# Patient Record
Sex: Female | Born: 1985 | Race: Black or African American | Hispanic: No | Marital: Single | Smoking: Never smoker
Health system: Southern US, Community
[De-identification: ages and names within clinical notes are randomized; demographics above are authoritative.]

## PROBLEM LIST (undated history)

## (undated) ENCOUNTER — Inpatient Hospital Stay (HOSPITAL_COMMUNITY): Payer: Self-pay

## (undated) ENCOUNTER — Emergency Department (HOSPITAL_BASED_OUTPATIENT_CLINIC_OR_DEPARTMENT_OTHER): Admission: EM | Payer: Medicaid Other | Source: Home / Self Care

## (undated) DIAGNOSIS — I10 Essential (primary) hypertension: Secondary | ICD-10-CM

## (undated) DIAGNOSIS — Z8619 Personal history of other infectious and parasitic diseases: Secondary | ICD-10-CM

## (undated) HISTORY — PX: LAPAROSCOPY: SHX197

## (undated) HISTORY — DX: Personal history of other infectious and parasitic diseases: Z86.19

---

## 2006-01-08 ENCOUNTER — Emergency Department (HOSPITAL_COMMUNITY): Admission: EM | Admit: 2006-01-08 | Discharge: 2006-01-08 | Payer: Self-pay | Admitting: Emergency Medicine

## 2006-03-12 ENCOUNTER — Emergency Department (HOSPITAL_COMMUNITY): Admission: EM | Admit: 2006-03-12 | Discharge: 2006-03-12 | Payer: Self-pay | Admitting: Emergency Medicine

## 2010-08-27 ENCOUNTER — Emergency Department (HOSPITAL_COMMUNITY): Payer: Self-pay

## 2010-08-27 ENCOUNTER — Emergency Department (HOSPITAL_COMMUNITY)
Admission: EM | Admit: 2010-08-27 | Discharge: 2010-08-27 | Disposition: A | Discharge status: Home / Self Care | Payer: Self-pay | Attending: Emergency Medicine | Admitting: Emergency Medicine

## 2010-08-27 DIAGNOSIS — M79609 Pain in unspecified limb: Secondary | ICD-10-CM | POA: Insufficient documentation

## 2010-11-20 ENCOUNTER — Inpatient Hospital Stay (INDEPENDENT_AMBULATORY_CARE_PROVIDER_SITE_OTHER)
Admission: RE | Admit: 2010-11-20 | Discharge: 2010-11-20 | Disposition: A | Discharge status: Home / Self Care | Payer: Self-pay | Source: Ambulatory Visit | Attending: Family Medicine | Admitting: Family Medicine

## 2010-11-20 DIAGNOSIS — Z331 Pregnant state, incidental: Secondary | ICD-10-CM

## 2010-11-20 LAB — POCT URINALYSIS DIP (DEVICE)
Glucose, UA: NEGATIVE mg/dL
Nitrite: NEGATIVE
Protein, ur: NEGATIVE mg/dL
Specific Gravity, Urine: 1.02 (ref 1.005–1.030)
Urobilinogen, UA: 0.2 mg/dL (ref 0.0–1.0)

## 2010-11-20 LAB — POCT PREGNANCY, URINE: Preg Test, Ur: POSITIVE

## 2011-01-23 LAB — OB RESULTS CONSOLE ABO/RH: RH Type: POSITIVE

## 2011-01-23 LAB — OB RESULTS CONSOLE RPR: RPR: NONREACTIVE

## 2011-01-23 LAB — OB RESULTS CONSOLE GBS: GBS: POSITIVE

## 2011-01-23 LAB — OB RESULTS CONSOLE HEPATITIS B SURFACE ANTIGEN: Hepatitis B Surface Ag: NEGATIVE

## 2011-02-12 DIAGNOSIS — O26851 Spotting complicating pregnancy, first trimester: Secondary | ICD-10-CM | POA: Insufficient documentation

## 2011-02-12 DIAGNOSIS — Z87440 Personal history of urinary (tract) infections: Secondary | ICD-10-CM | POA: Insufficient documentation

## 2011-02-12 DIAGNOSIS — O10919 Unspecified pre-existing hypertension complicating pregnancy, unspecified trimester: Secondary | ICD-10-CM | POA: Insufficient documentation

## 2011-02-12 DIAGNOSIS — R638 Other symptoms and signs concerning food and fluid intake: Secondary | ICD-10-CM | POA: Insufficient documentation

## 2011-02-12 DIAGNOSIS — E669 Obesity, unspecified: Secondary | ICD-10-CM | POA: Insufficient documentation

## 2011-02-12 DIAGNOSIS — O0930 Supervision of pregnancy with insufficient antenatal care, unspecified trimester: Secondary | ICD-10-CM | POA: Insufficient documentation

## 2011-02-12 LAB — OB RESULTS CONSOLE GC/CHLAMYDIA: Chlamydia: NEGATIVE

## 2011-02-13 NOTE — L&D Delivery Note (Signed)
Delivery Note  At 11:03 PM a viable female was delivered via Vaginal, Spontaneous Delivery (Presentation: Right Occiput Anterior). Delivered through loose nuchal cord, shoulders delivered easily  APGAR: 8, 9; weight . pending Placenta status: Intact, Spontaneous.  Cord: 3 vessels with the following complications: None.  Cord pH: n/a  Anesthesia: Epidural  Episiotomy: None Lacerations: 1st degree Suture Repair: 3.0 vicryl rapide Est. Blood Loss (mL): 300 Increased bleeding noted, cytotec placed PR fundus firm and bleeding controlled   Mom to postpartum.  Baby to nursery-stable. Skin-skin Pt considering OCP for contraception   Shadi Larner M 07/19/2011, 11:45 PM

## 2011-04-09 ENCOUNTER — Other Ambulatory Visit: Payer: Self-pay

## 2011-05-01 ENCOUNTER — Other Ambulatory Visit (INDEPENDENT_AMBULATORY_CARE_PROVIDER_SITE_OTHER): Payer: Medicaid Other

## 2011-05-01 ENCOUNTER — Encounter (INDEPENDENT_AMBULATORY_CARE_PROVIDER_SITE_OTHER): Payer: Medicaid Other | Admitting: Obstetrics and Gynecology

## 2011-05-01 DIAGNOSIS — O10019 Pre-existing essential hypertension complicating pregnancy, unspecified trimester: Secondary | ICD-10-CM

## 2011-05-01 DIAGNOSIS — Z331 Pregnant state, incidental: Secondary | ICD-10-CM

## 2011-05-17 ENCOUNTER — Encounter (INDEPENDENT_AMBULATORY_CARE_PROVIDER_SITE_OTHER): Payer: Medicaid Other | Admitting: Obstetrics and Gynecology

## 2011-05-17 DIAGNOSIS — Z331 Pregnant state, incidental: Secondary | ICD-10-CM

## 2011-06-01 DIAGNOSIS — O0930 Supervision of pregnancy with insufficient antenatal care, unspecified trimester: Secondary | ICD-10-CM

## 2011-06-01 DIAGNOSIS — O26851 Spotting complicating pregnancy, first trimester: Secondary | ICD-10-CM

## 2011-06-01 DIAGNOSIS — O10919 Unspecified pre-existing hypertension complicating pregnancy, unspecified trimester: Secondary | ICD-10-CM

## 2011-06-01 DIAGNOSIS — E669 Obesity, unspecified: Secondary | ICD-10-CM

## 2011-06-01 DIAGNOSIS — R638 Other symptoms and signs concerning food and fluid intake: Secondary | ICD-10-CM

## 2011-06-01 DIAGNOSIS — O99820 Streptococcus B carrier state complicating pregnancy: Secondary | ICD-10-CM

## 2011-06-04 ENCOUNTER — Other Ambulatory Visit: Payer: Self-pay | Admitting: Obstetrics and Gynecology

## 2011-06-04 ENCOUNTER — Encounter: Payer: Self-pay | Admitting: Obstetrics and Gynecology

## 2011-06-04 ENCOUNTER — Other Ambulatory Visit: Payer: Medicaid Other | Admitting: Obstetrics and Gynecology

## 2011-06-04 ENCOUNTER — Ambulatory Visit (INDEPENDENT_AMBULATORY_CARE_PROVIDER_SITE_OTHER): Payer: Medicaid Other

## 2011-06-04 ENCOUNTER — Ambulatory Visit (INDEPENDENT_AMBULATORY_CARE_PROVIDER_SITE_OTHER): Payer: Medicaid Other | Admitting: Obstetrics and Gynecology

## 2011-06-04 VITALS — BP 120/84 | Ht 62.0 in | Wt 247.5 lb

## 2011-06-04 DIAGNOSIS — Z331 Pregnant state, incidental: Secondary | ICD-10-CM

## 2011-06-04 DIAGNOSIS — O10019 Pre-existing essential hypertension complicating pregnancy, unspecified trimester: Secondary | ICD-10-CM

## 2011-06-04 LAB — US OB FOLLOW UP

## 2011-06-04 NOTE — Patient Instructions (Signed)

## 2011-06-04 NOTE — Progress Notes (Signed)
Stacey Levine [redacted]w[redacted]d   Doing well GFM No ctx, VB or LOF Growth [place] today for [facility] EDC 6day ahead EFW 4lb9oz =54% Cx=4.95cm AFI 40% Posterior, grade 0 placenta Staring CBE this week, took BF classes  Plan: FKC PTL sx's rv'd Repeat growth Korea in 4wks ROB in 2wks

## 2011-06-11 NOTE — Progress Notes (Signed)
Pt needs biweekly NST, in addition to growth Korea every 4 weeks, secondary to [facility].  Please sched for NST w NV

## 2011-06-14 ENCOUNTER — Other Ambulatory Visit: Payer: Self-pay | Admitting: Obstetrics and Gynecology

## 2011-06-14 ENCOUNTER — Encounter: Payer: Self-pay | Admitting: Obstetrics and Gynecology

## 2011-06-14 DIAGNOSIS — O10919 Unspecified pre-existing hypertension complicating pregnancy, unspecified trimester: Secondary | ICD-10-CM

## 2011-06-18 ENCOUNTER — Ambulatory Visit (INDEPENDENT_AMBULATORY_CARE_PROVIDER_SITE_OTHER): Payer: Medicaid Other | Admitting: Obstetrics and Gynecology

## 2011-06-18 ENCOUNTER — Ambulatory Visit: Payer: Medicaid Other

## 2011-06-18 ENCOUNTER — Encounter: Payer: Self-pay | Admitting: Obstetrics and Gynecology

## 2011-06-18 DIAGNOSIS — O169 Unspecified maternal hypertension, unspecified trimester: Secondary | ICD-10-CM

## 2011-06-18 DIAGNOSIS — IMO0002 Reserved for concepts with insufficient information to code with codable children: Secondary | ICD-10-CM

## 2011-06-18 NOTE — Progress Notes (Signed)
A/P GBS due at 36 weeks CHTN NST twice a week Korea at 36 weeks for growth Fetal kick counts reviewed Labor reviewed with pt All patients  questions answered NST reactive

## 2011-06-18 NOTE — Patient Instructions (Signed)
Fetal Monitoring, Fetal Movement Assessment Fetal movement assessment (FMA) is done by the pregnant woman herself by counting and recording the baby's movements over a certain time period. It is done to see if there are problems with the pregnancy and the baby. Identifying and correcting problems may prevent serious problems from developing with the fetus, including fetal loss. Some pregnancies are complicated by the mother's medical problems. Some of these problems are type 1 diabetes mellitus, high blood pressure and other chronic medical illnesses. This is why it is important to monitor the baby before birth.  OTHER TECHNIQUES OF MONITORING YOUR BABY BEFORE BIRTH Several tests are in use. These include:  Nonstress test (NST). This test monitors the baby's heart rate when the baby moves.   Contraction stress test (CST). This test monitors the baby's heart rate during a contraction of the uterus.   Fetal biophysical profile (BPP). This measures and evaluates 5 observations of the baby:   The nonstress test.   The baby's breathing.   The baby's movements.   The baby's muscle tone.   The amount of amniotic fluid.   Modified BPP. This measures the volume of fluid in different parts of the amniotic sac (amniotic fluid index) and the results of the nonstress test.   Umbilical artery doppler velocimetry. This evaluates the blood flow through the umbilical cord.  There are several very serious problems that cannot be predicted or detected with any of the fetal monitoring procedures. These problems include separation (abruption) of the placenta or when the fetus chokes on the umbilical cord (umbilical cord accident). Your caregiver will help you understand your tests and what they mean for you and your baby. It is your responsibility to obtain the results of your test. LET YOUR CAREGIVER KNOW ABOUT:   Any medications you are taking including prescription and over-the-counter drugs, herbs, eye  drops and creams.   If you have a fever.   If you have an infection.   If you are sick.  RISKS AND COMPLICATIONS  There are no risks or complications to the mother or fetus with FMA. BEFORE THE PROCEDURE  Do not take medications that may decrease or increase the baby's heart rate and/or movements.   Eat a full meal at least 2 hours before the test.   Do not smoke if you are pregnant. If you smoke, stop at least 2 days before the test. It is best not to smoke at all when you are pregnant.  PROCEDURE Sometimes, a mother notices her baby moves less before there are problems. Because of this, it is believed that fetal movement checking by the mother (kick counts) is a good way to check the baby before birth. There are different ways of doing this. Two good ways are:  The woman lies on her side and counts distinct (individual) fetal movements. A feeling of 10 distinct movements in a period of up to 2 hours is considered reassuring. When 10 movements are felt, you may stop counting.   Women are instructed to count fetal movements for 1 hour, three times per week. The count is good if, after one week, it equals or is over the woman's previously established baseline count. If the count is lower, further checking of your baby is needed.  AFTER THE PROCEDURE You may resume your usual activities. HOME CARE INSTRUCTIONS   Follow your caregiver's advice and recommendations.   Be aware of your baby's movements. Are they normal, less than usual or more than usual?     Make and keep the rest of your prenatal appointments.  SEEK MEDICAL CARE IF:   You develop a temperature of 100 F (37.8 C) or higher.   You have a bloody mucus discharge from the vagina (a bloody show).  SEEK IMMEDIATE MEDICAL CARE IF:   You do not feel the baby move.   You think the baby's movements are too little or too many.   You develop contractions.   You develop vaginal bleeding.   You have belly (abdominal) pain.    You have leaking or a gush of fluid from the vagina.  Document Released: 01/19/2002 Document Revised: 01/18/2011 Document Reviewed: 05/24/2008 Exit[employer] Patient Information 2012 [employer]. 

## 2011-06-21 ENCOUNTER — Other Ambulatory Visit: Payer: Self-pay | Admitting: Obstetrics and Gynecology

## 2011-06-21 ENCOUNTER — Ambulatory Visit (INDEPENDENT_AMBULATORY_CARE_PROVIDER_SITE_OTHER): Payer: Medicaid Other | Admitting: Obstetrics and Gynecology

## 2011-06-21 VITALS — BP 130/80

## 2011-06-21 DIAGNOSIS — O10019 Pre-existing essential hypertension complicating pregnancy, unspecified trimester: Secondary | ICD-10-CM

## 2011-06-21 DIAGNOSIS — O10919 Unspecified pre-existing hypertension complicating pregnancy, unspecified trimester: Secondary | ICD-10-CM

## 2011-06-21 NOTE — Progress Notes (Signed)
NST ONLY, PT DENIES ANY SXS OR COMPLICATIONS TODAY.

## 2011-06-21 NOTE — Progress Notes (Signed)
NST only today for chronic hypertension--Reactive, sporadic UCs that patient is not aware of.

## 2011-06-25 ENCOUNTER — Ambulatory Visit: Payer: Medicaid Other

## 2011-06-25 ENCOUNTER — Ambulatory Visit (INDEPENDENT_AMBULATORY_CARE_PROVIDER_SITE_OTHER): Payer: Medicaid Other | Admitting: Obstetrics and Gynecology

## 2011-06-25 VITALS — BP 136/74

## 2011-06-25 DIAGNOSIS — O10919 Unspecified pre-existing hypertension complicating pregnancy, unspecified trimester: Secondary | ICD-10-CM

## 2011-06-25 DIAGNOSIS — O10019 Pre-existing essential hypertension complicating pregnancy, unspecified trimester: Secondary | ICD-10-CM

## 2011-06-25 DIAGNOSIS — Z331 Pregnant state, incidental: Secondary | ICD-10-CM

## 2011-06-25 NOTE — Progress Notes (Signed)
Patient ID: Stacey Levine, female   DOB: Dec 18, 1985, 26 y.o.   MRN: [id number] NST reactive, f/o as scheduled. Lavera Guise, CNM

## 2011-06-28 ENCOUNTER — Other Ambulatory Visit: Payer: Medicaid Other

## 2011-06-28 ENCOUNTER — Ambulatory Visit (INDEPENDENT_AMBULATORY_CARE_PROVIDER_SITE_OTHER): Payer: Medicaid Other | Admitting: Obstetrics and Gynecology

## 2011-06-28 VITALS — BP 142/76 | Wt 255.0 lb

## 2011-06-28 DIAGNOSIS — IMO0002 Reserved for concepts with insufficient information to code with codable children: Secondary | ICD-10-CM

## 2011-06-28 DIAGNOSIS — Z331 Pregnant state, incidental: Secondary | ICD-10-CM

## 2011-06-28 DIAGNOSIS — O139 Gestational [pregnancy-induced] hypertension without significant proteinuria, unspecified trimester: Secondary | ICD-10-CM

## 2011-06-28 NOTE — Progress Notes (Signed)
BP RE-CHECK=134/84  KNP;PT WENT TO BR BEFORE LEAVING AND SAW SOME BLOOD W/ WIPING ONLY, PT STATES DID HAVE CX CHECKED TODAY, PT INFORMED NORMAL TO HAVE SOME SPOTTING AFTER A CX CHECK, ADVISED TO MONITOR, IF INCREASES OR OTHER CONCERNS TO CALL BACK ANYTIME.

## 2011-06-28 NOTE — Progress Notes (Signed)
Pt complains of feet swelling and hurts when walking JM

## 2011-06-28 NOTE — Progress Notes (Signed)
CHTN: denies symptoms of PIH except edema 3+ BH No change in discharge Labor signs reviewed PIH warnings given

## 2011-07-02 ENCOUNTER — Ambulatory Visit (INDEPENDENT_AMBULATORY_CARE_PROVIDER_SITE_OTHER): Payer: Medicaid Other | Admitting: Obstetrics and Gynecology

## 2011-07-02 VITALS — BP 120/78 | Wt 254.0 lb

## 2011-07-02 DIAGNOSIS — O10019 Pre-existing essential hypertension complicating pregnancy, unspecified trimester: Secondary | ICD-10-CM

## 2011-07-02 DIAGNOSIS — O99810 Abnormal glucose complicating pregnancy: Secondary | ICD-10-CM

## 2011-07-02 DIAGNOSIS — O24419 Gestational diabetes mellitus in pregnancy, unspecified control: Secondary | ICD-10-CM

## 2011-07-02 DIAGNOSIS — I10 Essential (primary) hypertension: Secondary | ICD-10-CM

## 2011-07-02 DIAGNOSIS — Z331 Pregnant state, incidental: Secondary | ICD-10-CM

## 2011-07-02 DIAGNOSIS — Z3490 Encounter for supervision of normal pregnancy, unspecified, unspecified trimester: Secondary | ICD-10-CM

## 2011-07-02 LAB — CULTURE, BETA STREP (GROUP B ONLY)

## 2011-07-02 NOTE — Progress Notes (Addendum)
No complaints Denies headache, visual changes, or abdominal pain NST reactive Ultrasound next available for size greater than dates Fetal kick counts and labor precautions Group B strep was negative Return to office for ultrasound in about a week Continue twice weekly NSTs and at next visit with ultrasound for EFW, will check BPP

## 2011-07-02 NOTE — Progress Notes (Signed)
Pt. No issues today. Nst today.per AR NST reactive.

## 2011-07-03 ENCOUNTER — Encounter: Payer: Self-pay | Admitting: Obstetrics and Gynecology

## 2011-07-05 ENCOUNTER — Ambulatory Visit (INDEPENDENT_AMBULATORY_CARE_PROVIDER_SITE_OTHER): Payer: Medicaid Other | Admitting: Obstetrics and Gynecology

## 2011-07-05 VITALS — BP 142/80

## 2011-07-05 DIAGNOSIS — O10019 Pre-existing essential hypertension complicating pregnancy, unspecified trimester: Secondary | ICD-10-CM

## 2011-07-05 DIAGNOSIS — O10919 Unspecified pre-existing hypertension complicating pregnancy, unspecified trimester: Secondary | ICD-10-CM

## 2011-07-10 ENCOUNTER — Other Ambulatory Visit: Payer: Self-pay | Admitting: Obstetrics and Gynecology

## 2011-07-10 ENCOUNTER — Encounter: Payer: Self-pay | Admitting: Obstetrics and Gynecology

## 2011-07-10 ENCOUNTER — Ambulatory Visit (INDEPENDENT_AMBULATORY_CARE_PROVIDER_SITE_OTHER): Payer: Medicaid Other

## 2011-07-10 ENCOUNTER — Ambulatory Visit (INDEPENDENT_AMBULATORY_CARE_PROVIDER_SITE_OTHER): Payer: Medicaid Other | Admitting: Obstetrics and Gynecology

## 2011-07-10 VITALS — BP 130/80 | Wt 266.0 lb

## 2011-07-10 DIAGNOSIS — Z3490 Encounter for supervision of normal pregnancy, unspecified, unspecified trimester: Secondary | ICD-10-CM

## 2011-07-10 DIAGNOSIS — I10 Essential (primary) hypertension: Secondary | ICD-10-CM

## 2011-07-10 DIAGNOSIS — O10019 Pre-existing essential hypertension complicating pregnancy, unspecified trimester: Secondary | ICD-10-CM

## 2011-07-10 LAB — COMPREHENSIVE METABOLIC PANEL
Albumin: 2.6 g/dL — ABNORMAL LOW (ref 3.5–5.2)
BUN: 7 mg/dL (ref 6–23)
CO2: 22 mEq/L (ref 19–32)
Calcium: 9.2 mg/dL (ref 8.4–10.5)
Chloride: 107 mEq/L (ref 96–112)
Creat: 0.84 mg/dL (ref 0.50–1.10)
Glucose, Bld: 68 mg/dL — ABNORMAL LOW (ref 70–99)
Potassium: 4.6 mEq/L (ref 3.5–5.3)

## 2011-07-10 LAB — CBC
HCT: 33 % — ABNORMAL LOW (ref 36.0–46.0)
Hemoglobin: 11.4 g/dL — ABNORMAL LOW (ref 12.0–15.0)
MCHC: 34.5 g/dL (ref 30.0–36.0)
RBC: 3.88 MIL/uL (ref 3.87–5.11)
WBC: 5.6 10*3/uL (ref 4.0–10.5)

## 2011-07-10 LAB — URIC ACID: Uric Acid, Serum: 6 mg/dL (ref 2.4–7.0)

## 2011-07-10 LAB — US OB FOLLOW UP

## 2011-07-10 LAB — LACTATE DEHYDROGENASE: LDH: 175 U/L (ref 94–250)

## 2011-07-10 NOTE — Progress Notes (Signed)
Addendum: Had 24hr urine at 18wks=150mg  total prot CMP at 27wks was nl VE today=3/80/-1

## 2011-07-10 NOTE — Progress Notes (Signed)
Wants cx today

## 2011-07-10 NOTE — Progress Notes (Signed)
[redacted]w[redacted]d Growth Korea today - EFW 7# 70%,  AFI - 55%  BPP 8/8 Had +GBS in urine at NOB - will plan to tx in labor  Doing well, 3+ pitting pedal edema, comfort measures discussed VE=3/80/-1 Denies any PIH sx's PIH labs today (normal) and 24hour urine to be collected and return NV BP stable 130/80 today rv'd FKC and labor sx's Plans: biweekly NST's, will sched for Fri NST only and to return 24hr urine ROB and NST next Tuesday

## 2011-07-10 NOTE — Patient Instructions (Signed)

## 2011-07-10 NOTE — Progress Notes (Signed)
NST reactive.

## 2011-07-11 ENCOUNTER — Telehealth: Payer: Self-pay | Admitting: Obstetrics and Gynecology

## 2011-07-11 NOTE — Telephone Encounter (Signed)
TC to pt.  Appts scheduled per SL.   Pt states still has some edema but is resting with feet elevated.   +FM  To call with any increase in sx or concerns. Pt verbalizes comprehension.

## 2011-07-11 NOTE — Telephone Encounter (Signed)
Message copied by Mason Jim on Wed Jul 11, 2011 11:12 AM ------      Message from: Malissa Hippo.      Created: Tue Jul 10, 2011 11:48 PM      Regarding: Pt needs sched for NST       Pt has CHTN            Pt needs NST only Friday - she can return 24hour urine than - please notify on call CNM to look for results (I'm actually on that night), please leave me VM if I need to look for it.       NST and ROB w provider next Tuesday and NST next Friday      I don't see that she scheduled anymore visits??            Thanks      SL

## 2011-07-13 ENCOUNTER — Ambulatory Visit (INDEPENDENT_AMBULATORY_CARE_PROVIDER_SITE_OTHER): Payer: Medicaid Other

## 2011-07-13 ENCOUNTER — Inpatient Hospital Stay (HOSPITAL_COMMUNITY)
Admission: AD | Admit: 2011-07-13 | Discharge: 2011-07-13 | Disposition: A | Discharge status: Home / Self Care | Payer: Medicaid Other | Source: Ambulatory Visit | Attending: Obstetrics and Gynecology | Admitting: Obstetrics and Gynecology

## 2011-07-13 ENCOUNTER — Encounter (HOSPITAL_COMMUNITY): Payer: Self-pay

## 2011-07-13 ENCOUNTER — Other Ambulatory Visit: Payer: Medicaid Other

## 2011-07-13 VITALS — BP 138/84

## 2011-07-13 DIAGNOSIS — R079 Chest pain, unspecified: Secondary | ICD-10-CM | POA: Insufficient documentation

## 2011-07-13 DIAGNOSIS — K219 Gastro-esophageal reflux disease without esophagitis: Secondary | ICD-10-CM

## 2011-07-13 DIAGNOSIS — O10019 Pre-existing essential hypertension complicating pregnancy, unspecified trimester: Secondary | ICD-10-CM | POA: Insufficient documentation

## 2011-07-13 DIAGNOSIS — Z3400 Encounter for supervision of normal first pregnancy, unspecified trimester: Secondary | ICD-10-CM

## 2011-07-13 DIAGNOSIS — Z3403 Encounter for supervision of normal first pregnancy, third trimester: Secondary | ICD-10-CM

## 2011-07-13 DIAGNOSIS — O99891 Other specified diseases and conditions complicating pregnancy: Secondary | ICD-10-CM | POA: Insufficient documentation

## 2011-07-13 DIAGNOSIS — Z331 Pregnant state, incidental: Secondary | ICD-10-CM

## 2011-07-13 DIAGNOSIS — I10 Essential (primary) hypertension: Secondary | ICD-10-CM

## 2011-07-13 LAB — PROTEIN, URINE, 24 HOUR
Protein, 24H Urine: 124 mg/d — ABNORMAL HIGH (ref 50–100)
Protein, Urine: 15 mg/dL

## 2011-07-13 LAB — CREATININE CLEARANCE, URINE, 24 HOUR: Creatinine: 0.84 mg/dL (ref 0.50–1.10)

## 2011-07-13 MED ORDER — PANTOPRAZOLE SODIUM 40 MG PO TBEC
40.0000 mg | DELAYED_RELEASE_TABLET | Freq: Every day | ORAL | Status: DC
  Administered 2011-07-13: 40 mg via ORAL
  Filled 2011-07-13 (×2): qty 1

## 2011-07-13 MED ORDER — PANTOPRAZOLE SODIUM 40 MG PO TBEC: 40.0000 mg | DELAYED_RELEASE_TABLET | Freq: Every day | ORAL | Status: DC

## 2011-07-13 NOTE — Discharge Instructions (Signed)
Heartburn During Pregnancy  Heartburn is a burning sensation in the chest caused by stomach acid backing up into the esophagus. Heartburn (also known as "reflux") is common in pregnancy because a certain hormone (progesterone) changes. The progesterone hormone may relax the valve that separates the esophagus from the stomach. This allows acid to go up into the esophagus, causing heartburn. Heartburn may also happen in pregnancy because the enlarging uterus pushes up on the stomach, which pushes more acid into the esophagus. This is especially true in the later stages of pregnancy. Heartburn problems usually go away after giving birth. CAUSES   The progesterone hormone.   Changing hormone levels.   The growing uterus that pushes stomach acid upward.   Large meals.   Certain foods and drinks.   Exercise.   Increased acid production.  SYMPTOMS   Burning pain in the chest or lower throat.   Bitter taste in the mouth.   Coughing.  DIAGNOSIS  Heartburn is typically diagnosed by your caregiver when taking a careful history of your concern. Your caregiver may order a blood test to check for a certain type of bacteria that is associated with heartburn. Sometimes, heartburn is diagnosed by prescribing a heartburn medicine to see if the symptoms improve. It is rare in pregnancy to have a procedure called an endoscopy. This is when a tube with a light and a camera on the end is used to examine the esophagus and the stomach. TREATMENT   Your caregiver may tell you to use certain over-the-counter medicines (antacids, acid reducers) for mild heartburn.   Your caregiver may prescribe medicines to decrease stomach acid or to protect your stomach lining.   Your caregiver may recommend certain diet changes.   For severe cases, your caregiver may recommend that the head of the bed be elevated on blocks. (Sleeping with more pillows is not an effective treatment as it only changes the position of your  head and does not improve the main problem of stomach acid refluxing into the esophagus.)  HOME CARE INSTRUCTIONS   Take all medicines as directed by your caregiver.   Raise the head of your bed by putting blocks under the legs if instructed to by your caregiver.   Do not exercise right after eating.   Avoid eating 2 or 3 hours before bed. Do not lie down right after eating.   Eat small meals throughout the day instead of 3 large meals.   Identify foods and beverages that make your symptoms worse and avoid them. Foods you may want to avoid include:   Peppers.   Chocolate.   High-fat foods, including fried foods.   Spicy foods.   Garlic and onions.   Citrus fruits, including oranges, grapefruit, lemons, and limes.   Food containing tomatoes or tomato products.   Mint.   Carbonated and caffeinated drinks.   Vinegar.  SEEK IMMEDIATE MEDICAL CARE IF:   You have severe chest pain that goes down your arm or into your jaw or neck.   You feel sweaty, dizzy, or lightheaded.   You become short of breath.   You vomit blood.   You have difficulty or pain with swallowing.   You have bloody or black, tarry stools.   You have episodes of heartburn more than 3 times a week, for more than 2 weeks.  MAKE SURE YOU:  Understand these instructions.   Will watch your condition.   Will get help right away if you are not doing well or   get worse.  Document Released: 01/27/2000 Document Revised: 01/18/2011 Document Reviewed: 07/20/2010 Exit[employer] Patient Information 2012 [employer]. 

## 2011-07-13 NOTE — MAU Provider Note (Signed)
History   26 yo G1P0 at 38 weeks presented after calling office c/o "chest pain" and some cramping.  Denies HA, visual symptoms, or epigastric pain.  Turned in 24 hour urine today at office, with PIH labs done on Wednesday due to pedal edema.  Chest discomfort is usually at night, after lying down, feels like burning in mid-chest and epigastric area.  Resolves spontaneously.  No vomiting, fever, or any other symptoms.  Has not been on any medication for GERD.  Pregnancy remarkable for: Patient Active Problem List  Diagnoses  . Chronic hypertension in pregnancy  . Obesity  . Late prenatal care  . Increased BMI  . Spotting in first trimester  . GBS (group B Streptococcus carrier), +RV culture, currently pregnant  . GERD (gastroesophageal reflux disease)     Chief Complaint  Patient presents with  . Chest Pain  . Abdominal Pain    OB History    Grav Para Term Preterm Abortions TAB SAB Ect Mult Living   1               Past Medical History  Diagnosis Date  . H/O varicella   . H/O cystitis   . Yeast infection   . GERD (gastroesophageal reflux disease)     Past Surgical History  Procedure Date  . No past surgeries     Family History  Problem Relation Age of Onset  . Hypertension Mother   . Stroke Maternal Uncle   . Diabetes Maternal Grandmother   . Hypertension Maternal Grandfather   . Mental illness Paternal Grandfather   . Asthma Cousin   . Anesthesia problems Neg Hx   . Hypotension Neg Hx   . Malignant hyperthermia Neg Hx   . Pseudochol deficiency Neg Hx     History  Substance Use Topics  . Smoking status: Never Smoker   . Smokeless tobacco: Never Used  . Alcohol Use: No    Allergies:  Allergies  Allergen Reactions  . Pollen Extract Itching and Swelling    Prescriptions prior to admission  Medication Sig Dispense Refill  . Prenatal Vit-Fe Fumarate-FA (PRENATAL MULTIVITAMIN) TABS Take 1 tablet by mouth at bedtime.          Physical Exam   Blood  pressure 136/76, pulse 78, temperature 98.4 F (36.9 C), temperature source Oral, resp. rate 20, height 5\' 5"  (1.651 m), weight 266 lb (120.657 kg), last menstrual period 10/20/2010, SpO2 99.00%.  Chest clear Heart RRR without murmur Abd gravid, NT, no RUQ pain Pelvic--deferred per patient request Ext DTR 2+ without clonus, 1-2+ edema.  FHR reactive, no decels Very occasional contractions, mild.   ED Course  IUP at 38 weeks GERD 24 hour urine pending  Plan: D/C home with Rx for Protonix e-prescribed PIH precautions reviewed. Await 24 hour urine. GERD information given. Follow-up as scheduled at [facility] on Tuesday for NST and visit.  Nigel Bridgeman, CNM, [place] 07/13/11 6:20pm

## 2011-07-13 NOTE — MAU Note (Signed)
No adverse effect from protonix

## 2011-07-13 NOTE — MAU Note (Signed)
VLatham, CNM on unit in MAU, notified that pt is here for sternal chest pain, upper then generalized abd pain. VLatham, CNM will see pt shortly.

## 2011-07-13 NOTE — MAU Note (Signed)
Patient states she has been having chest (patient points to mid sternal) twice in the past 2 weeks with the last time on 5-29.Marland Kitchen Has been having abdominal pain(patient points to epigastric area) since this am. Having some contractions off and on. Denies bleeding or leaking and reports good fetal movement.

## 2011-07-13 NOTE — MAU Note (Signed)
Pt states has had sternal chest pain x2 weeks. Usually gets pain while in bed. Also noted upper abdominal pain down into lower abdomen as well that began this am. Pain was upper abdominal, then gradually radiated down. Hx acid reflux with pregnancy. Denies vaginal d/c changes or bleeding.

## 2011-07-15 ENCOUNTER — Telehealth: Payer: Self-pay | Admitting: Obstetrics and Gynecology

## 2011-07-15 NOTE — Telephone Encounter (Signed)
TC from [facility]--24 hour urine protein 124. No f/u needed.

## 2011-07-17 ENCOUNTER — Encounter: Payer: Medicaid Other | Admitting: Obstetrics and Gynecology

## 2011-07-17 ENCOUNTER — Ambulatory Visit (INDEPENDENT_AMBULATORY_CARE_PROVIDER_SITE_OTHER): Payer: Medicaid Other | Admitting: Obstetrics and Gynecology

## 2011-07-17 ENCOUNTER — Ambulatory Visit: Payer: Medicaid Other

## 2011-07-17 VITALS — BP 140/90 | Wt 272.0 lb

## 2011-07-17 DIAGNOSIS — Z331 Pregnant state, incidental: Secondary | ICD-10-CM

## 2011-07-17 DIAGNOSIS — O10019 Pre-existing essential hypertension complicating pregnancy, unspecified trimester: Secondary | ICD-10-CM

## 2011-07-17 DIAGNOSIS — I10 Essential (primary) hypertension: Secondary | ICD-10-CM

## 2011-07-17 DIAGNOSIS — O10919 Unspecified pre-existing hypertension complicating pregnancy, unspecified trimester: Secondary | ICD-10-CM

## 2011-07-17 NOTE — Progress Notes (Signed)
2nd attempt BP 140/90  Declines cx check today NST Reactive today

## 2011-07-17 NOTE — Progress Notes (Signed)
[redacted]w[redacted]d GFM Some ctx, no VB Denies HA/N/VRUQ pain NST reactive today cat 1 From 5-31, 24hour urine protein =124, PIH labs were normal  Pt has NST on Friday, will schedule growth [place] next week w BPP

## 2011-07-18 ENCOUNTER — Telehealth: Payer: Self-pay | Admitting: Obstetrics and Gynecology

## 2011-07-18 ENCOUNTER — Encounter: Payer: Medicaid Other | Admitting: Obstetrics and Gynecology

## 2011-07-18 NOTE — Telephone Encounter (Signed)
Triage/pt has appt coming up

## 2011-07-18 NOTE — Telephone Encounter (Signed)
Triage/2nd call 

## 2011-07-19 ENCOUNTER — Encounter (HOSPITAL_COMMUNITY): Payer: Self-pay | Admitting: Anesthesiology

## 2011-07-19 ENCOUNTER — Inpatient Hospital Stay (HOSPITAL_COMMUNITY): Payer: Medicaid Other | Admitting: Anesthesiology

## 2011-07-19 ENCOUNTER — Inpatient Hospital Stay (HOSPITAL_COMMUNITY)
Admission: AD | Admit: 2011-07-19 | Discharge: 2011-07-21 | DRG: 774 | Disposition: A | Discharge status: Home / Self Care | Payer: Medicaid Other | Source: Ambulatory Visit | Attending: Obstetrics and Gynecology | Admitting: Obstetrics and Gynecology

## 2011-07-19 ENCOUNTER — Encounter (HOSPITAL_COMMUNITY): Payer: Self-pay

## 2011-07-19 ENCOUNTER — Telehealth: Payer: Self-pay | Admitting: Obstetrics and Gynecology

## 2011-07-19 DIAGNOSIS — E669 Obesity, unspecified: Secondary | ICD-10-CM | POA: Diagnosis present

## 2011-07-19 DIAGNOSIS — Z22330 Carrier of Group B streptococcus: Secondary | ICD-10-CM

## 2011-07-19 DIAGNOSIS — R638 Other symptoms and signs concerning food and fluid intake: Secondary | ICD-10-CM | POA: Diagnosis present

## 2011-07-19 DIAGNOSIS — K219 Gastro-esophageal reflux disease without esophagitis: Secondary | ICD-10-CM | POA: Diagnosis present

## 2011-07-19 DIAGNOSIS — O1002 Pre-existing essential hypertension complicating childbirth: Principal | ICD-10-CM | POA: Diagnosis present

## 2011-07-19 DIAGNOSIS — Z87440 Personal history of urinary (tract) infections: Secondary | ICD-10-CM

## 2011-07-19 DIAGNOSIS — O99892 Other specified diseases and conditions complicating childbirth: Secondary | ICD-10-CM | POA: Diagnosis present

## 2011-07-19 DIAGNOSIS — O8620 Urinary tract infection following delivery, unspecified: Secondary | ICD-10-CM | POA: Diagnosis not present

## 2011-07-19 DIAGNOSIS — D649 Anemia, unspecified: Secondary | ICD-10-CM | POA: Diagnosis not present

## 2011-07-19 DIAGNOSIS — O10919 Unspecified pre-existing hypertension complicating pregnancy, unspecified trimester: Secondary | ICD-10-CM | POA: Diagnosis present

## 2011-07-19 DIAGNOSIS — IMO0002 Reserved for concepts with insufficient information to code with codable children: Secondary | ICD-10-CM | POA: Insufficient documentation

## 2011-07-19 DIAGNOSIS — O09899 Supervision of other high risk pregnancies, unspecified trimester: Secondary | ICD-10-CM

## 2011-07-19 DIAGNOSIS — O9903 Anemia complicating the puerperium: Secondary | ICD-10-CM | POA: Diagnosis not present

## 2011-07-19 DIAGNOSIS — R7401 Elevation of levels of liver transaminase levels: Secondary | ICD-10-CM

## 2011-07-19 DIAGNOSIS — O9081 Anemia of the puerperium: Secondary | ICD-10-CM | POA: Diagnosis not present

## 2011-07-19 HISTORY — DX: Essential (primary) hypertension: I10

## 2011-07-19 LAB — COMPREHENSIVE METABOLIC PANEL
Albumin: 2.5 g/dL — ABNORMAL LOW (ref 3.5–5.2)
BUN: 5 mg/dL — ABNORMAL LOW (ref 6–23)
Chloride: 107 mEq/L (ref 96–112)
Creatinine, Ser: 0.73 mg/dL (ref 0.50–1.10)
GFR calc Af Amer: >90 mL/min (ref >90)
GFR calc non Af Amer: >90 mL/min (ref >90)
Glucose, Bld: 73 mg/dL (ref 70–99)
Total Bilirubin: 0.3 mg/dL (ref 0.3–1.2)

## 2011-07-19 LAB — CBC
HCT: 30 % — ABNORMAL LOW (ref 36.0–46.0)
Hemoglobin: 10.1 g/dL — ABNORMAL LOW (ref 12.0–15.0)
MCH: 29.1 pg (ref 26.0–34.0)
MCH: 29 pg (ref 26.0–34.0)
MCHC: 33.7 g/dL (ref 30.0–36.0)
MCV: 86.1 fL (ref 78.0–100.0)
MCV: 86.2 fL (ref 78.0–100.0)
Platelets: 285 10*3/uL (ref 150–400)
RBC: 3.82 MIL/uL — ABNORMAL LOW (ref 3.87–5.11)
RDW: 14.4 % (ref 11.5–15.5)

## 2011-07-19 MED ORDER — DIPHENHYDRAMINE HCL 50 MG/ML IJ SOLN: 12.5000 mg | INTRAMUSCULAR | Status: DC | PRN

## 2011-07-19 MED ORDER — ACETAMINOPHEN 325 MG PO TABS: 650.0000 mg | ORAL_TABLET | ORAL | Status: DC | PRN

## 2011-07-19 MED ORDER — LACTATED RINGERS IV SOLN
500.0000 mL | Freq: Once | INTRAVENOUS | Status: AC
  Administered 2011-07-19: 1000 mL via INTRAVENOUS

## 2011-07-19 MED ORDER — EPHEDRINE 5 MG/ML INJ
10.0000 mg | INTRAVENOUS | Status: DC | PRN
  Filled 2011-07-19: qty 2
  Filled 2011-07-19: qty 4

## 2011-07-19 MED ORDER — IBUPROFEN 600 MG PO TABS: 600.0000 mg | ORAL_TABLET | Freq: Four times a day (QID) | ORAL | Status: DC | PRN

## 2011-07-19 MED ORDER — MISOPROSTOL 200 MCG PO TABS
ORAL_TABLET | ORAL | Status: AC
  Administered 2011-07-19: 600 ug
  Filled 2011-07-19: qty 3

## 2011-07-19 MED ORDER — PHENYLEPHRINE 40 MCG/ML (10ML) SYRINGE FOR IV PUSH (FOR BLOOD PRESSURE SUPPORT)
80.0000 ug | PREFILLED_SYRINGE | INTRAVENOUS | Status: DC | PRN
  Filled 2011-07-19: qty 2
  Filled 2011-07-19: qty 5

## 2011-07-19 MED ORDER — TERBUTALINE SULFATE 1 MG/ML IJ SOLN: 0.2500 mg | Freq: Once | INTRAMUSCULAR | Status: AC | PRN

## 2011-07-19 MED ORDER — OXYCODONE-ACETAMINOPHEN 5-325 MG PO TABS: 1.0000 | ORAL_TABLET | ORAL | Status: DC | PRN

## 2011-07-19 MED ORDER — OXYTOCIN BOLUS FROM INFUSION
500.0000 mL | Freq: Once | INTRAVENOUS | Status: AC
  Administered 2011-07-19: 500 mL via INTRAVENOUS
  Filled 2011-07-19: qty 500

## 2011-07-19 MED ORDER — OXYTOCIN 20 UNITS IN LACTATED RINGERS INFUSION - SIMPLE
1.0000 m[IU]/min | INTRAVENOUS | Status: DC
  Administered 2011-07-19: 1 m[IU]/min via INTRAVENOUS
  Filled 2011-07-19: qty 1000

## 2011-07-19 MED ORDER — LACTATED RINGERS IV SOLN
INTRAVENOUS | Status: DC
  Administered 2011-07-19 (×2): via INTRAVENOUS
  Administered 2011-07-19: 125 mL/h via INTRAVENOUS

## 2011-07-19 MED ORDER — ONDANSETRON HCL 4 MG/2ML IJ SOLN: 4.0000 mg | Freq: Four times a day (QID) | INTRAMUSCULAR | Status: DC | PRN

## 2011-07-19 MED ORDER — OXYTOCIN 10 UNIT/ML IJ SOLN: 10.0000 [IU] | Freq: Once | INTRAMUSCULAR | Status: DC

## 2011-07-19 MED ORDER — PHENYLEPHRINE 40 MCG/ML (10ML) SYRINGE FOR IV PUSH (FOR BLOOD PRESSURE SUPPORT)
80.0000 ug | PREFILLED_SYRINGE | INTRAVENOUS | Status: DC | PRN
  Filled 2011-07-19: qty 2

## 2011-07-19 MED ORDER — FENTANYL 2.5 MCG/ML BUPIVACAINE 1/10 % EPIDURAL INFUSION (WH - ANES)
INTRAMUSCULAR | Status: DC | PRN
  Administered 2011-07-19: 13 mL/h via EPIDURAL

## 2011-07-19 MED ORDER — CITRIC ACID-SODIUM CITRATE 334-500 MG/5ML PO SOLN: 30.0000 mL | ORAL | Status: DC | PRN

## 2011-07-19 MED ORDER — SODIUM BICARBONATE 8.4 % IV SOLN
INTRAVENOUS | Status: DC | PRN
  Administered 2011-07-19: 4 mL via EPIDURAL

## 2011-07-19 MED ORDER — EPHEDRINE 5 MG/ML INJ
10.0000 mg | INTRAVENOUS | Status: DC | PRN
  Filled 2011-07-19: qty 2

## 2011-07-19 MED ORDER — PENICILLIN G POTASSIUM 5000000 UNITS IJ SOLR
5.0000 10*6.[IU] | Freq: Once | INTRAVENOUS | Status: AC
  Administered 2011-07-19: 5 10*6.[IU] via INTRAVENOUS
  Filled 2011-07-19: qty 5

## 2011-07-19 MED ORDER — LIDOCAINE HCL (PF) 1 % IJ SOLN
30.0000 mL | INTRAMUSCULAR | Status: DC | PRN
  Administered 2011-07-19: 30 mL via SUBCUTANEOUS
  Filled 2011-07-19: qty 30

## 2011-07-19 MED ORDER — FENTANYL 2.5 MCG/ML BUPIVACAINE 1/10 % EPIDURAL INFUSION (WH - ANES)
14.0000 mL/h | INTRAMUSCULAR | Status: DC
  Administered 2011-07-19: 14 mL/h via EPIDURAL
  Filled 2011-07-19 (×2): qty 60

## 2011-07-19 MED ORDER — LACTATED RINGERS IV SOLN: 500.0000 mL | INTRAVENOUS | Status: DC | PRN

## 2011-07-19 MED ORDER — OXYTOCIN 20 UNITS IN LACTATED RINGERS INFUSION - SIMPLE: 125.0000 mL/h | Freq: Once | INTRAVENOUS | Status: DC

## 2011-07-19 MED ORDER — PENICILLIN G POTASSIUM 5000000 UNITS IJ SOLR
2.5000 10*6.[IU] | INTRAVENOUS | Status: DC
  Administered 2011-07-19 (×3): 2.5 10*6.[IU] via INTRAVENOUS
  Filled 2011-07-19 (×8): qty 2.5

## 2011-07-19 NOTE — Progress Notes (Signed)
  Subjective: Received epidural about 1 hour ago--comfortable.  Family at bedside.  Objective: BP 129/80  Pulse 71  Temp(Src) 97.7 F (36.5 C) (Axillary)  Resp 18  Ht 5\' 5"  (1.651 m)  Wt 272 lb (123.378 kg)  BMI 45.26 kg/m2  SpO2 100%  LMP 10/20/2010      FHT:  Category 1 UC:   regular, every 2-3 minutes SVE:   Dilation: 8 Effacement (%): 100 Station: -1 Exam by:: Emilee Hero, CNM More cervix on right than left, with transverse position of vtx, slightly asynclitic. IUPC inserted. Pitocin on 10 mu/min. MVUs < 150.  Labs: Lab Results  Component Value Date   WBC 6.5 07/19/2011   HGB 10.1* 07/19/2011   HCT 30.0* 07/19/2011   MCV 86.2 07/19/2011   PLT 252 07/19/2011    Assessment / Plan: Progressive labor. Will position in exaggerated Sims to facilitate rotation and descent. Augment to adequacy.   Nigel Bridgeman 07/19/2011, 5:55 PM

## 2011-07-19 NOTE — Progress Notes (Signed)
Patient ID: Stacey Levine, female   DOB: 07-14-1985, 26 y.o.   MRN: [id number] .Subjective:  Feels pressure urge to push now  Objective: BP 110/80  Pulse 90  Temp(Src) 98.1 F (36.7 C) (Oral)  Resp 18  Ht 5\' 5"  (1.651 m)  Wt 272 lb (123.378 kg)  BMI 45.26 kg/m2  SpO2 100%  LMP 10/20/2010   FHT:  FHR: 130 bpm, variability: moderate,  accelerations:  Present,  decelerations:  Absent UC:   regular, every 1-3 minutes SVE:   Dilation: 10 Effacement (%): 100 Station: -1 Exam by:: s. Miral Hoopes, cnm    Assessment / Plan: actively pushing now GBS pos   Fetal Wellbeing:  Category I Pain Control:  Epidural  Dr Normand Sloop updated  Malissa Hippo 07/19/2011, 9:37 PM

## 2011-07-19 NOTE — Anesthesia Procedure Notes (Signed)
Epidural Patient location during procedure: OB  Preanesthetic Checklist Completed: patient identified, site marked, surgical consent, pre-op evaluation, timeout performed, IV checked, risks and benefits discussed and monitors and equipment checked  Epidural Patient position: sitting Prep: site prepped and draped and DuraPrep Patient monitoring: continuous pulse ox and blood pressure Approach: midline Injection technique: LOR air  Needle:  Needle type: Tuohy  Needle gauge: 17 G Needle length: 9 cm Needle insertion depth: 9 cm Catheter type: closed end flexible Catheter size: 19 Gauge Test dose: negative  Assessment Events: blood not aspirated, injection not painful, no injection resistance, negative IV test and no paresthesia  Additional Notes Dosing of Epidural:  1st dose, through needle ............................................Marland Kitchen epi 1:200K + Xylocaine 40 mg  2nd dose, through catheter, after waiting 3 minutes...Marland KitchenMarland Kitchenepi 1:200K + Xylocaine 40 mg  3rd dose, through catheter after waiting 3 minutes .............................Marcaine   4mg    ( mg Marcaine are expressed as equivilent  cc's medication removed from the 0.1%Bupiv / fentanyl syringe from L&D pump)  ( 2% Xylo charted as a single dose in Epic Meds for ease of charting; actual dosing was fractionated as above, for saftey's sake)  As each dose occurred, patient was free of IV sx; and patient exhibited no evidence of SA injection.  Patient is more comfortable after epidural dosed. Please see RN's note for documentation of vital signs,and FHR which are stable.  Patient reminded not to try to ambulate with numb legs, and that an RN must be present the 1st time she attempts to get up.

## 2011-07-19 NOTE — Progress Notes (Signed)
Patient ID: Stacey Levine, female   DOB: 01/05/1986, 26 y.o.   MRN: [id number] .Subjective: Comfortable, denies urge to push, family supportive at bs   Objective: BP 110/80  Pulse 90  Temp(Src) 98.1 F (36.7 C) (Oral)  Resp 18  Ht 5\' 5"  (1.651 m)  Wt 272 lb (123.378 kg)  BMI 45.26 kg/m2  SpO2 100%  LMP 10/20/2010   FHT:  FHR: 130 bpm, variability: moderate,  accelerations:  Present,  decelerations:  Present occ early variables UC:   regular, every 1-2 minutes SVE:   Dilation: 10 Effacement (%): 100 Station: -1 Exam by:: s. Valynn Schamberger, cnm  Pitocin at 10mu MVU's 220  Assessment / Plan: complete, will labor down, wait for urge to push GBS pos, has rcv'd PCN  BP's stable    Fetal Wellbeing:  Category I Pain Control:  Epidural  Update physician PRN  Malissa Hippo 07/19/2011, 9:00 PM

## 2011-07-19 NOTE — Progress Notes (Signed)
  Subjective: Doing well--resting comfortably, unaware of contractions.  Objective: BP 141/71  Pulse 67  Temp(Src) 97.7 F (36.5 C) (Axillary)  Resp 18  Ht 5\' 5"  (1.651 m)  Wt 272 lb (123.378 kg)  BMI 45.26 kg/m2  LMP 10/20/2010      FHT: Category 1 UC:   Irregular, sporadic Pitocin on 8 mu/min.  Labs: Lab Results  Component Value Date   WBC 5.8 07/19/2011   HGB 11.1* 07/19/2011   HCT 32.9* 07/19/2011   MCV 86.1 07/19/2011   PLT 285 07/19/2011    Assessment / Plan: SROM at 4am, no labor GBS positive Will continue to augment.  Nigel Bridgeman 07/19/2011, 3:07 PM

## 2011-07-19 NOTE — Progress Notes (Signed)
  Subjective: Comfortable--no urge to push.  Objective: BP 119/59  Pulse 67  Temp(Src) 97.9 F (36.6 C) (Oral)  Resp 18  Ht 5\' 5"  (1.651 m)  Wt 272 lb (123.378 kg)  BMI 45.26 kg/m2  SpO2 100%  LMP 10/20/2010     Filed Vitals:   07/19/11 1732 07/19/11 1737 07/19/11 1741 07/19/11 1801  BP: 115/55 115/58 119/66 119/59  Pulse: 69 66 77 67  Temp:    97.9 F (36.6 C)  TempSrc:    Oral  Resp:      Height:      Weight:      SpO2:         FHT:  Category 1, with some mild variables, early decels at times UC:  q 2-3 min, MVUs 220.  Labs: Lab Results  Component Value Date   WBC 6.5 07/19/2011   HGB 10.1* 07/19/2011   HCT 30.0* 07/19/2011   MCV 86.2 07/19/2011   PLT 252 07/19/2011    Assessment / Plan: Adequate labor--will await urge to push.   Nigel Bridgeman, CNM, [place] 07/19/2011, 6:50 PM

## 2011-07-19 NOTE — Telephone Encounter (Signed)
Incoming call from pt reporting that water broke, large amt clear fluid on floor and still leaking, denies VB, reports +FM. Instructed to come to MAU

## 2011-07-19 NOTE — Anesthesia Preprocedure Evaluation (Addendum)
Anesthesia Evaluation  Patient identified by MRN, date of birth, ID band Patient awake    Reviewed: Allergy & Precautions, H&P , Patient's Chart, lab work & pertinent test results  Airway Mallampati: II TM Distance: >3 FB Neck ROM: full    Dental  (+) Teeth Intact   Pulmonary  breath sounds clear to auscultation        Cardiovascular hypertension, Rhythm:regular Rate:Normal     Neuro/Psych    GI/Hepatic GERD-  ,  Endo/Other  Morbid obesity  Renal/GU      Musculoskeletal   Abdominal   Peds  Hematology   Anesthesia Other Findings       Reproductive/Obstetrics (+) Pregnancy                          Anesthesia Physical Anesthesia Plan  ASA: III  Anesthesia Plan: Epidural   Post-op Pain Management:    Induction:   Airway Management Planned:   Additional Equipment:   Intra-op Plan:   Post-operative Plan:   Informed Consent: I have reviewed the patients History and Physical, chart, labs and discussed the procedure including the risks, benefits and alternatives for the proposed anesthesia with the patient or authorized representative who has indicated his/her understanding and acceptance.   Dental Advisory Given  Plan Discussed with:   Anesthesia Plan Comments: (Labs checked- platelets confirmed with RN in room. Fetal heart tracing, per RN, reported to be stable enough for sitting procedure. Discussed epidural, and patient consents to the procedure:  included risk of possible headache,backache, failed block, allergic reaction, and nerve injury. This patient was asked if she had any questions or concerns before the procedure started. )        Anesthesia Quick Evaluation

## 2011-07-19 NOTE — Progress Notes (Signed)
  Subjective: Comfortable, aware of some contractions, but not uncomfortable.    Objective: BP 154/87  Pulse 77  Temp(Src) 98 F (36.7 C) (Oral)  Resp 18  Ht 5\' 5"  (1.651 m)  Wt 272 lb (123.378 kg)  BMI 45.26 kg/m2  LMP 10/20/2010      FHT:  Category 1 UC:   Irregular, mild Leaking clear fluid.  Labs: Lab Results  Component Value Date   WBC 5.8 07/19/2011   HGB 11.1* 07/19/2011   HCT 32.9* 07/19/2011   MCV 86.1 07/19/2011   PLT 285 07/19/2011    Assessment / Plan: IUP at 36 6/7 weeks SROM, minimal labor Chronic HTN Positive GBS  Plan: Reviewed status with patient--recommend pitocin augmentation if no labor advancement by 10 am. Patient agreeable with plan. Plans epidural as labor advances.    Nigel Bridgeman 07/19/2011, 7:43 AM

## 2011-07-19 NOTE — Telephone Encounter (Signed)
Pt in hospital       

## 2011-07-19 NOTE — H&P (Signed)
Stacey Levine is a 26 y.o. female presenting for SROM at 0400, had gush of clear fluid. Having irregular ctx, bloody show, +FM. Denies HA/N/V/RUQ pain.  Pregnancy significant for: 1. CHTN - no meds 2. Obesity -  3. Late PNC 4. 1st trim spotting 5. GERD 6. GBS bacteruria   HPI: Pt began prenatal care at [facility] at 16wks. She had a normal anatomy US and has had normal growth US,most recent on 5-28. She's had normal PIH labs and normal 24 hour urine protein. She had an early 1hr gtt that was normal as well as a normal 1hr gtt in the 3rd trimester. She's had normal antenatal testing. She had GBS bacteruria at NOB visit.  Maternal Medical History:  Reason for admission: Reason for admission: rupture of membranes.  Contractions: Frequency: irregular.   Perceived severity is mild.    Fetal activity: Perceived fetal activity is normal.   Last perceived fetal movement was within the past hour.    Prenatal complications: Hypertension.     OB History    Grav Para Term Preterm Abortions TAB SAB Ect Mult Living   1              Past Medical History  Diagnosis Date  . H/O varicella   . H/O cystitis   . Yeast infection   . GERD (gastroesophageal reflux disease)   . Hypertension     no meds   Past Surgical History  Procedure Date  . No past surgeries    Family History: family history includes Asthma in her cousin; Diabetes in her maternal grandmother; Hypertension in her maternal grandfather and mother; Mental illness in her paternal grandfather; and Stroke in her maternal uncle.  There is no history of Anesthesia problems, and Hypotension, and Malignant hyperthermia, and Pseudochol deficiency, . Social History:  reports that she has never smoked. She has never used smokeless tobacco. She reports that she does not drink alcohol or use illicit drugs.  Review of Systems  All other systems reviewed and are negative.    Dilation: 3.5 Effacement (%): 100 Station: 0 Exam by:: S Abie Cheek  CNM Blood pressure 158/87, pulse 93, temperature 98.7 F (37.1 C), temperature source Oral, resp. rate 18, last menstrual period 10/20/2010. Maternal Exam:  Uterine Assessment: Contraction strength is mild.  Contraction duration is 50 seconds. Contraction frequency is irregular.   Abdomen: Patient reports no abdominal tenderness. Fundal height is aga.   Estimated fetal weight is 7-8.   Fetal presentation: vertex  Introitus: Normal vulva. Normal vagina.  Vagina is negative for discharge.  Amniotic fluid character: clear and bloody. Leaking mod amt clear blood tinged fluid since 0400  Pelvis: adequate for delivery.   Cervix: Cervix evaluated by digital exam.     Fetal Exam Fetal Monitor Review: Mode: ultrasound.   Baseline rate: 130.  Variability: moderate (6-25 bpm).   Pattern: accelerations present and no decelerations.    Fetal State Assessment: Category I - tracings are normal.     Physical Exam  Nursing note and vitals reviewed. Constitutional: She is oriented to person, place, and time. She appears well-developed and well-nourished. No distress.  HENT:  Head: Normocephalic.  Neck: Normal range of motion.  Cardiovascular: Normal rate, regular rhythm and normal heart sounds.   Respiratory: Effort normal and breath sounds normal.  GI: Soft. Bowel sounds are normal. She exhibits no distension.  Genitourinary: Vagina normal. No vaginal discharge found.       VE=3.5/100/0 vtx  Musculoskeletal: Normal range of motion.  She exhibits edema.  Neurological: She is alert and oriented to person, place, and time. She has normal reflexes.  Skin: Skin is warm and dry.  Psychiatric: She has a normal mood and affect. Her behavior is normal.    Prenatal labs: ABO, Rh: O/Positive/-- (12/11 0000) Antibody: Negative (12/11 0000) Rubella: Immune (12/11 0000) RPR: Nonreactive (12/11 0000)  HBsAg: Negative (12/11 0000)  HIV: Non-reactive (12/11 0000)  GBS: Positive (12/11 0000)  Early  1hr gtt and 28wk 1hr WNL Quad WNL 24hour urine at 18wks 150mg  protein Repeat 24hour on 5/31 = 124mg  protein, PIH labs were normal    Assessment/Plan: IUP at [redacted]w[redacted]d SROM early labor Elevated BP - hx CHTN GBS pos FHR reassuring   Admit to birthing suites Dr Stefano Gaul attending Routine L&D orders [facility] labs PCN per protocol  Epidural when pt requests Augment PRN    Stacey Levine 07/19/2011, 5:47 AM

## 2011-07-19 NOTE — MAU Note (Signed)
Leaking clear fluid since 0400 this morning. Had 3 contractions in the last hour. Denies vaginal bleeding. Positive fetal movement.

## 2011-07-19 NOTE — Progress Notes (Addendum)
  Subjective:   Objective: BP 149/88  Pulse 73  Temp(Src) 98.2 F (36.8 C) (Oral)  Resp 18  Ht 5\' 5"  (1.651 m)  Wt 272 lb (123.378 kg)  BMI 45.26 kg/m2  LMP 10/20/2010      FHT: Category 1 on Beacon monitor UC:   Irregular, mild, SVE:   3-4 cm, 90%, vtx -1 station. Leaking clear fluid Pitocin on 3 mu/min  Filed Vitals:   07/19/11 0745 07/19/11 1010 07/19/11 1131 07/19/11 1201  BP: 138/75 133/96 132/70 149/88  Pulse: 79 79 169 73  Temp: 97.9 F (36.6 C) 98.1 F (36.7 C) 98.2 F (36.8 C)   TempSrc: Oral Oral Oral   Resp: 20 20 18 18   Height:      Weight:         Assessment / Plan: SROM, minimal labor Positive GBS Continue pitocin augmentation. Epidural prn.  Nigel Bridgeman 07/19/2011, 12:23 PM

## 2011-07-20 ENCOUNTER — Other Ambulatory Visit: Payer: Medicaid Other

## 2011-07-20 ENCOUNTER — Encounter (HOSPITAL_COMMUNITY): Payer: Self-pay | Admitting: Registered Nurse

## 2011-07-20 LAB — CBC
MCH: 28.7 pg (ref 26.0–34.0)
MCHC: 33.8 g/dL (ref 30.0–36.0)
Platelets: 239 10*3/uL (ref 150–400)
RBC: 3.21 MIL/uL — ABNORMAL LOW (ref 3.87–5.11)
RDW: 14.3 % (ref 11.5–15.5)
RDW: 14.3 % (ref 11.5–15.5)
WBC: 9.3 10*3/uL (ref 4.0–10.5)

## 2011-07-20 MED ORDER — BENZOCAINE-MENTHOL 20-0.5 % EX AERO
1.0000 "application " | INHALATION_SPRAY | CUTANEOUS | Status: DC | PRN
  Filled 2011-07-20: qty 56

## 2011-07-20 MED ORDER — ONDANSETRON 8 MG PO TBDP
8.0000 mg | ORAL_TABLET | Freq: Three times a day (TID) | ORAL | Status: DC | PRN
  Administered 2011-07-20: 8 mg via ORAL
  Filled 2011-07-20: qty 1

## 2011-07-20 MED ORDER — DIPHENHYDRAMINE HCL 25 MG PO CAPS: 25.0000 mg | ORAL_CAPSULE | Freq: Four times a day (QID) | ORAL | Status: DC | PRN

## 2011-07-20 MED ORDER — WITCH HAZEL-GLYCERIN EX PADS: 1.0000 "application " | MEDICATED_PAD | CUTANEOUS | Status: DC | PRN

## 2011-07-20 MED ORDER — FAMOTIDINE 20 MG PO TABS
20.0000 mg | ORAL_TABLET | Freq: Every day | ORAL | Status: DC
  Administered 2011-07-20 – 2011-07-21 (×2): 20 mg via ORAL
  Filled 2011-07-20 (×2): qty 1

## 2011-07-20 MED ORDER — CALCIUM CARBONATE ANTACID 500 MG PO CHEW
800.0000 mg | CHEWABLE_TABLET | ORAL | Status: DC | PRN
  Administered 2011-07-20: 800 mg via ORAL
  Filled 2011-07-20: qty 1
  Filled 2011-07-20: qty 3
  Filled 2011-07-20: qty 1

## 2011-07-20 MED ORDER — LANOLIN HYDROUS EX OINT: TOPICAL_OINTMENT | CUTANEOUS | Status: DC | PRN

## 2011-07-20 MED ORDER — LACTATED RINGERS IV SOLN
INTRAVENOUS | Status: DC
  Administered 2011-07-21 (×2): via INTRAVENOUS

## 2011-07-20 MED ORDER — SENNOSIDES-DOCUSATE SODIUM 8.6-50 MG PO TABS: 2.0000 | ORAL_TABLET | Freq: Every day | ORAL | Status: DC

## 2011-07-20 MED ORDER — ZOLPIDEM TARTRATE 5 MG PO TABS: 5.0000 mg | ORAL_TABLET | Freq: Every evening | ORAL | Status: DC | PRN

## 2011-07-20 MED ORDER — SIMETHICONE 80 MG PO CHEW: 80.0000 mg | CHEWABLE_TABLET | ORAL | Status: DC | PRN

## 2011-07-20 MED ORDER — PRENATAL MULTIVITAMIN CH
1.0000 | ORAL_TABLET | Freq: Every day | ORAL | Status: DC
  Administered 2011-07-20: 1 via ORAL
  Filled 2011-07-20: qty 1

## 2011-07-20 MED ORDER — TETANUS-DIPHTH-ACELL PERTUSSIS 5-2.5-18.5 LF-MCG/0.5 IM SUSP
0.5000 mL | Freq: Once | INTRAMUSCULAR | Status: AC
  Administered 2011-07-20: 0.5 mL via INTRAMUSCULAR
  Filled 2011-07-20: qty 0.5

## 2011-07-20 MED ORDER — ONDANSETRON HCL 4 MG PO TABS: 4.0000 mg | ORAL_TABLET | ORAL | Status: DC | PRN

## 2011-07-20 MED ORDER — PANTOPRAZOLE SODIUM 40 MG IV SOLR
40.0000 mg | INTRAVENOUS | Status: DC
  Administered 2011-07-21: 40 mg via INTRAVENOUS
  Filled 2011-07-20 (×2): qty 40

## 2011-07-20 MED ORDER — HYDROMORPHONE HCL PF 1 MG/ML IJ SOLN
2.0000 mg | INTRAMUSCULAR | Status: DC | PRN
  Administered 2011-07-21: 2 mg via INTRAVENOUS
  Filled 2011-07-20: qty 2

## 2011-07-20 MED ORDER — MEASLES, MUMPS & RUBELLA VAC ~~LOC~~ INJ
0.5000 mL | INJECTION | Freq: Once | SUBCUTANEOUS | Status: DC
  Filled 2011-07-20: qty 0.5

## 2011-07-20 MED ORDER — IBUPROFEN 600 MG PO TABS
600.0000 mg | ORAL_TABLET | Freq: Four times a day (QID) | ORAL | Status: DC
  Administered 2011-07-20 – 2011-07-21 (×4): 600 mg via ORAL
  Filled 2011-07-20 (×4): qty 1

## 2011-07-20 MED ORDER — ONDANSETRON HCL 4 MG/2ML IJ SOLN: 4.0000 mg | INTRAMUSCULAR | Status: DC | PRN

## 2011-07-20 MED ORDER — DIBUCAINE 1 % RE OINT: 1.0000 "application " | TOPICAL_OINTMENT | RECTAL | Status: DC | PRN

## 2011-07-20 MED ORDER — OXYCODONE-ACETAMINOPHEN 5-325 MG PO TABS: 1.0000 | ORAL_TABLET | ORAL | Status: DC | PRN

## 2011-07-20 MED ORDER — FERROUS SULFATE 325 (65 FE) MG PO TABS
325.0000 mg | ORAL_TABLET | Freq: Every day | ORAL | Status: DC
  Administered 2011-07-20: 325 mg via ORAL
  Filled 2011-07-20: qty 1

## 2011-07-20 NOTE — Progress Notes (Signed)
UR chart review completed.  

## 2011-07-20 NOTE — Progress Notes (Signed)
S: RN called w pt c/o acid reflux and severe indigestion since lunch time. Pt c/o mid epigastric pain and feels "full" vomited a few hours after eating lunch, hasn't tried eating again since. Denies nausea now, has been on protonix during pregnancy, did take that today w/o relief. States pain and discomfort is like the indigestion she's had before.  She denies any HA or blurry vision.   O:  Filed Vitals:   07/20/11 0100 07/20/11 0215 07/20/11 0611 07/20/11 1435  BP: 145/82 145/88 138/81 147/90  Pulse: 73 78 82 88  Temp: 98.8 F (37.1 C) 98.6 F (37 C) 98.8 F (37.1 C) 97.3 F (36.3 C)  TempSrc: Oral Oral Oral Oral  Resp: 20 18 18 17   Height:      Weight:      SpO2:       PE: Gen: NAD Heart: RRR Chest; CTAB ABD: soft, NT, BS x4 Pelvic deferred Ext: neg homans sign, mild B pedal edema  A: s/p SVD  CHTN - VSS, no evidence of pre-eclampsia  Acid reflux, indigestion  P: will order tums, pepsid and zofran Pt to just try liquids this evening, if indigestion improves will advance diet Cont w protonix daily  If no improvement will repeat PIH labs and consider chest xray

## 2011-07-20 NOTE — Progress Notes (Signed)
Verified with Pharmacy per Crystal that order note of "Calcium Carbonate 500mg =Elemental Calcium 200mg =1 tablet of "Tums" chewable tablets, so patient should receive (4) 500 mg tablets.

## 2011-07-20 NOTE — Progress Notes (Addendum)
Post Partum Day 1--s/p SVB Subjective: Doing well--up ad lib, without syncope or dizziness.  Perineum feeling much better, discomfort well-controlled with Motrin and ice pack.  No worsening of pain since delivery.  Bottlefeeding at present. Considering OCPs for contraception.  Objective: Blood pressure 138/81, pulse 82, temperature 98.8 F (37.1 C), temperature source Oral, resp. rate 18, height 5\' 5"  (1.651 m), weight 272 lb (123.378 kg), last menstrual period 10/20/2010, SpO2 100.00%, unknown if currently breastfeeding.  Physical Exam:  General: alert Lochia: appropriate Uterine Fundus: firm Incision: healing well DVT Evaluation: No evidence of DVT seen on physical exam. Negative Homan's sign.  Orthostatics stable.  Basename 07/20/11 0530 07/20/11 0025  HGB 9.2* 10.4*  HCT 27.7* 30.8*    Assessment/Plan: Plan for discharge tomorrow Continue current care Sitz baths to start 24 hours after delivery. Start Fe.   LOS: 1 day   Deryl Giroux 07/20/2011, 9:24 AM

## 2011-07-20 NOTE — Progress Notes (Signed)
Called CNM Lillard at [phone] for patient complaint of "chest pain 8-9 out of 10" and continued issues with "reflux." See new orders.

## 2011-07-20 NOTE — Progress Notes (Signed)
S: called by RN for pt cont c/o pain, did get any relief w PO meds. Did eat dinner at about 6pm, and has only had liquids since than, denies HA/N/V. Now pain 10/10, mid epigastric and RUQ, radiates to back, did have a BM today, is belching but no flatus, denies SOB or chest pain.  OCeasar Mons Vitals:   07/20/11 1610 07/20/11 1435 07/20/11 2204 07/20/11 2304  BP: 138/81 147/90 139/87 138/84  Pulse: 82 88 83 77  Temp: 98.8 F (37.1 C) 97.3 F (36.3 C) 98.4 F (36.9 C) 98.8 F (37.1 C)  TempSrc: Oral Oral Oral   Resp: 18 17 18 18   Height:      Weight:      SpO2:    97%   PE: grimacing now, mild distress Chest: clear RUQ tender to touch BS x4, abd soft NT,  Ext: mild LEE, neg homans  A: r/o pre-eclampsia? PIH labs were normal on admission, except for elevated LDH=295, alk phos=299,  24 hour urine protein done on 5-31 was 124mg   Gallbladder ?  PP day 1 w CHTN VSS   P: NPO IVF's, dilaudid for pain protonix IV PIH labs, amylase and lipase Abd [place] when pt has been NPO for 6 hours.  D/w Dr Estanislado Pandy

## 2011-07-21 ENCOUNTER — Inpatient Hospital Stay (HOSPITAL_COMMUNITY): Payer: Medicaid Other

## 2011-07-21 ENCOUNTER — Encounter (HOSPITAL_COMMUNITY): Payer: Self-pay

## 2011-07-21 DIAGNOSIS — O8620 Urinary tract infection following delivery, unspecified: Secondary | ICD-10-CM | POA: Diagnosis not present

## 2011-07-21 DIAGNOSIS — O9081 Anemia of the puerperium: Secondary | ICD-10-CM | POA: Diagnosis not present

## 2011-07-21 LAB — CBC
HCT: 25.3 % — ABNORMAL LOW (ref 36.0–46.0)
MCV: 86.6 fL (ref 78.0–100.0)
Platelets: 219 10*3/uL (ref 150–400)
RBC: 2.92 MIL/uL — ABNORMAL LOW (ref 3.87–5.11)
RDW: 14.5 % (ref 11.5–15.5)
WBC: 6.8 10*3/uL (ref 4.0–10.5)

## 2011-07-21 LAB — URINALYSIS, ROUTINE W REFLEX MICROSCOPIC
Glucose, UA: NEGATIVE mg/dL
Specific Gravity, Urine: 1.015 (ref 1.005–1.030)
pH: 6.5 (ref 5.0–8.0)

## 2011-07-21 LAB — URINE MICROSCOPIC-ADD ON

## 2011-07-21 LAB — COMPREHENSIVE METABOLIC PANEL
AST: 39 U/L — ABNORMAL HIGH (ref 0–37)
Albumin: 2.2 g/dL — ABNORMAL LOW (ref 3.5–5.2)
Alkaline Phosphatase: 270 U/L — ABNORMAL HIGH (ref 39–117)
BUN: 4 mg/dL — ABNORMAL LOW (ref 6–23)
CO2: 27 mEq/L (ref 19–32)
Chloride: 105 mEq/L (ref 96–112)
Creatinine, Ser: 0.82 mg/dL (ref 0.50–1.10)
GFR calc non Af Amer: >90 mL/min (ref >90)
Potassium: 3.9 mEq/L (ref 3.5–5.1)
Total Bilirubin: 0.8 mg/dL (ref 0.3–1.2)

## 2011-07-21 LAB — LACTATE DEHYDROGENASE: LDH: 216 U/L (ref 94–250)

## 2011-07-21 MED ORDER — NITROFURANTOIN MONOHYD MACRO 100 MG PO CAPS: 100.0000 mg | ORAL_CAPSULE | Freq: Two times a day (BID) | ORAL | Status: AC

## 2011-07-21 MED ORDER — FERRALET 90 90-1 MG PO TABS: 1.0000 | ORAL_TABLET | Freq: Every day | ORAL | Status: DC

## 2011-07-21 MED ORDER — LABETALOL HCL 200 MG PO TABS: 200.0000 mg | ORAL_TABLET | Freq: Two times a day (BID) | ORAL | Status: DC

## 2011-07-21 MED ORDER — HYDROCHLOROTHIAZIDE 25 MG PO TABS: 25.0000 mg | ORAL_TABLET | Freq: Every day | ORAL | Status: DC

## 2011-07-21 MED ORDER — IBUPROFEN 600 MG PO TABS: 600.0000 mg | ORAL_TABLET | Freq: Four times a day (QID) | ORAL | Status: AC | PRN

## 2011-07-21 NOTE — Discharge Instructions (Signed)
Diet for GERD or PUD Nutrition therapy can help ease the discomfort of gastroesophageal reflux disease (GERD) and peptic ulcer disease (PUD).  HOME CARE INSTRUCTIONS   Eat your meals slowly, in a relaxed setting.   Eat 5 to 6 small meals per day.   If a food causes distress, stop eating it for a period of time.  FOODS TO AVOID  Coffee, regular or decaffeinated.   Cola beverages, regular or low calorie.   Tea, regular or decaffeinated.   Pepper.   Cocoa.   High fat foods, including meats.   Butter, margarine, hydrogenated oil (trans fats).   Peppermint or spearmint (if you have GERD).   Fruits and vegetables if not tolerated.   Alcohol.   Nicotine (smoking or chewing). This is one of the most potent stimulants to acid production in the gastrointestinal tract.   Any food that seems to aggravate your condition.  If you have questions regarding your diet, ask your caregiver or a registered dietitian. TIPS  Lying flat may make symptoms worse. Keep the head of your bed raised 6 to 9 inches (15 to 23 cm) by using a foam wedge or blocks under the legs of the bed.   Do not lay down until 3 hours after eating a meal.   Daily physical activity may help reduce symptoms.  MAKE SURE YOU:   Understand these instructions.   Will watch your condition.   Will get help right away if you are not doing well or get worse.  Document Released: 01/29/2005 Document Revised: 01/18/2011 Document Reviewed: 12/15/2010 [facility] Patient Information 2012 [place], [place]. Postpartum Care After Vaginal Delivery After you deliver your baby, you will stay in the hospital for 24 to 72 hours, unless there were problems with the labor or delivery, or you have medical problems. While you are in the hospital, you will receive help and instructions on how to care for yourself and your baby. Your doctor will order pain medicine, in case you need it. You will have a small amount of bleeding from your vagina  and should change your sanitary pad frequently. Wash your hands thoroughly with soap and water for at least 20 seconds after changing pads and using the toilet. Let the nurses know if you begin to pass blood clots or your bleeding increases. Do not flush blood clots down the toilet before having the nurse look at them, to make sure there is no placental tissue with them. If you had an intravenous (IV), it will be removed within 24 hours, if there are no problems. The first time you get out of bed or take a shower, call the nurse to help you because you may get weak, lightheaded, or even faint. If you are breastfeeding, you may feel painful contractions of your uterus for a couple of weeks. This is normal. The contractions help your uterus get back to normal size. If you are not breastfeeding, wear a supportive bra and handle your breasts as little as possible until your milk has dried up. Hormones should not be given to dry up the breasts, because they can cause blood clots. You will be given your normal diet, unless you have diabetes or other medical problems.  The nurses may put an ice pack on your episiotomy (surgically enlarged opening), if you have one, to reduce the pain and swelling. On rare occasions, you may not be able to urinate and the nurse will need to empty your bladder with a catheter. If you had  a postpartum tubal ligation ("tying tubes," female sterilization), it should not make your stay in the hospital longer. You may have your baby in your room with you as much as you like, unless you or the baby has a problem. Use the bassinet (basket) for the baby when going to and from the nursery. Do not carry the baby. Do not leave the postpartum area. If the mother is Rh negative (lacks a protein on the red blood cells) and the baby is Rh positive, the mother should get a Rho-gam shot to prevent Rh problems with future pregnancies. You may be given written instructions for you and your baby, and  necessary medicines, when you are discharged from the hospital. Be sure you understand and follow the instructions as advised. HOME CARE INSTRUCTIONS   Follow instructions and take the medicines given to you.   Only take over-the-counter or prescription medicines for pain, discomfort, or fever as directed by your caregiver.   Do not take aspirin, because it can cause bleeding.   Increase your activities a little bit every day to build up your strength and endurance.   Do not drink alcohol, especially if you are breastfeeding or taking pain medicine.   Take your temperature twice a day and record it.   You may have a small amount of bleeding or spotting for 2 to 4 weeks. This is normal.   Do not use tampons or douche. Use sanitary pads.   Try to have someone stay and help you for a few days when you go home.   Try to rest or take a nap when the baby is sleeping.   If you are breastfeeding, wear a good support bra. If you are not breastfeeding, wear a supportive bra and do not stimulate your nipples.   Eat a healthy, nutritious diet and continue to take your prenatal vitamins.   Do not drive, do any heavy activities, or travel until your caregiver tells you it is okay.   Do not have intercourse until your caregiver gives you permission to do so.   Ask your caregiver when you can begin to exercise and what type of exercises to do.   Call your caregiver if you think you are having a problem from your delivery.   Call your pediatrician if you are having a problem with the baby.   Schedule your postpartum visit and keep it.  SEEK MEDICAL CARE IF:   You have a temperature of 100 F (37.8 C) or higher.   You have increased vaginal bleeding or are passing clots. Save any clots to show your caregiver.   You have bloody urine or pain when you urinate.   You have a bad smelling vaginal discharge.   You have increasing pain or swelling on your episiotomy.   You develop a severe  headache.   You feel depressed.   The episiotomy is separating.   You become dizzy or lightheaded.   You develop a rash.   You have a reaction or problems with your medicine.   You have pain, redness, or swelling at the intravenous site.  SEEK IMMEDIATE MEDICAL CARE IF:   You have chest pain.   You develop shortness of breath.   You pass out.   You develop pain, with or without swelling or redness in your leg.   You develop heavy vaginal bleeding, with or without blood clots.   You develop stomach pain.   You develop a bad smelling vaginal discharge.  MAKE SURE YOU:   Understand these instructions.   Will watch your condition.   Will get help right away if you are not doing well or get worse.  Document Released: 11/26/2006 Document Revised: 01/18/2011 Document Reviewed: 12/08/2008 [facility] Patient Information 2012 [place], [place]. Asymptomatic Bacteriuria, Female Your urine study shows bacteria in your urine. You do not have the usual symptoms of burning or frequent urination. This is why it is called asymptomatic. You may need treatment with antibiotics. Treatment is especially important if you are pregnant. Sometimes this condition can progress to a more severe bladder or kidney infection. Symptoms include burning when urinating, back pain, fever, nausea, or vomiting. Take your antibiotics as directed. Finish them even if you start to feel better. Drink enough water and fluids to keep your urine clear or pale yellow. Go to the bathroom more frequently to keep your bladder empty. Keep the area around the vagina and rectum clean. Wipe yourself from front to back after urinating. Call your caregiver to arrange for follow-up care.  SEEK IMMEDIATE MEDICAL CARE IF:  You develop repeated vomiting.   You develop severe back or abdominal pain.   You have abnormal vaginal discharge or bleeding.   You have blood in the urine.   You develop cramping or abdominal pain.   You  have a fever.  If you are pregnant and develop any of the above problems see your caregiver or seek care immediately. Document Released: 01/29/2005 Document Revised: 01/18/2011 Document Reviewed: 12/15/2008 [facility] Patient Information 2012 [place], [place]. Hypertension As your heart beats, it forces blood through your arteries. This force is your blood pressure. If the pressure is too high, it is called hypertension (HTN) or high blood pressure. HTN is dangerous because you may have it and not know it. High blood pressure may mean that your heart has to work harder to pump blood. Your arteries may be narrow or stiff. The extra work puts you at risk for heart disease, stroke, and other problems.  Blood pressure consists of two numbers, a higher number over a lower, 110/72, for example. It is stated as "110 over 72." The ideal is below 120 for the top number (systolic) and under 80 for the bottom (diastolic). Write down your blood pressure today. You should pay close attention to your blood pressure if you have certain conditions such as:  Heart failure.   Prior heart attack.   Diabetes   Chronic kidney disease.   Prior stroke.   Multiple risk factors for heart disease.  To see if you have HTN, your blood pressure should be measured while you are seated with your arm held at the level of the heart. It should be measured at least twice. A one-time elevated blood pressure reading (especially in the Emergency Department) does not mean that you need treatment. There may be conditions in which the blood pressure is different between your right and left arms. It is important to see your caregiver soon for a recheck. Most people have essential hypertension which means that there is not a specific cause. This type of high blood pressure may be lowered by changing lifestyle factors such as:  Stress.   Smoking.   Lack of exercise.   Excessive weight.   Drug/tobacco/alcohol use.   Eating less  salt.  Most people do not have symptoms from high blood pressure until it has caused damage to the body. Effective treatment can often prevent, delay or reduce that damage. TREATMENT  When a cause  has been identified, treatment for high blood pressure is directed at the cause. There are a large number of medications to treat HTN. These fall into several categories, and your caregiver will help you select the medicines that are best for you. Medications may have side effects. You should review side effects with your caregiver. If your blood pressure stays high after you have made lifestyle changes or started on medicines,   Your medication(s) may need to be changed.   Other problems may need to be addressed.   Be certain you understand your prescriptions, and know how and when to take your medicine.   Be sure to follow up with your caregiver within the time frame advised (usually within two weeks) to have your blood pressure rechecked and to review your medications.   If you are taking more than one medicine to lower your blood pressure, make sure you know how and at what times they should be taken. Taking two medicines at the same time can result in blood pressure that is too low.  SEEK IMMEDIATE MEDICAL CARE IF:  You develop a severe headache, blurred or changing vision, or confusion.   You have unusual weakness or numbness, or a faint feeling.   You have severe chest or abdominal pain, vomiting, or breathing problems.  MAKE SURE YOU:   Understand these instructions.   Will watch your condition.   Will get help right away if you are not doing well or get worse.  Document Released: 01/29/2005 Document Revised: 01/18/2011 Document Reviewed: 09/19/2007 [facility] Patient Information 2012 [place], [place].

## 2011-07-21 NOTE — Progress Notes (Signed)
Called CNM Lillard to report continued pain of 10/10. See orders.

## 2011-07-21 NOTE — Anesthesia Postprocedure Evaluation (Signed)
  Anesthesia Post-op Note  Patient: Stacey Levine  Procedure(s) Performed: * No procedures listed *  Patient Location: Mother/Baby  Anesthesia Type: Epidural  Level of Consciousness: awake  Airway and Oxygen Therapy: Patient Spontanous Breathing  Post-op Pain: mild  Post-op Assessment: Patient's Cardiovascular Status Stable and Respiratory Function Stable  Post-op Vital Signs: stable  Complications: No apparent anesthesia complications

## 2011-07-21 NOTE — Progress Notes (Signed)
Post Partum Day 2 Subjective: Pt states, "I feel much better this morning."  She has been NPO since MN and getting IVF.  She denies PIH s/s, no nausea or abdominal pain currently.  Rev'd pt's HTN h/o and never placed on medication and dx given while a student at [employer] [facility] about 1.5 years ago.  Formula feeding.  BM since delivery, and flatus finally this morning.  Voiding well. VB lighter.  Objective: Blood pressure 139/87, pulse 73, temperature 97.9 F (36.6 C), temperature source Oral, resp. rate 18, height 5\' 5"  (1.651 m), weight 272 lb (123.378 kg), last menstrual period 10/20/2010, SpO2 95.00%, unknown if currently breastfeeding. .. Filed Vitals:   07/20/11 2204 07/20/11 2304 07/21/11 0340 07/21/11 0610  BP: 139/87 138/84 143/76 139/87  Pulse: 83 77 71 73  Temp: 98.4 F (36.9 C) 98.8 F (37.1 C) 98.3 F (36.8 C) 97.9 F (36.6 C)  TempSrc: Oral  Oral Oral  Resp: 18 18 20 18   Height:      Weight:      SpO2:  97% 95%    Physical Exam:  General: alert, cooperative, no distress and moderately obese Lochia: appropriate, rubra Uterine Fundus: firm below umbilicus Incision: n/a DVT Evaluation: No evidence of DVT seen on physical exam. Negative Homan's sign. .. Results for orders placed during the hospital encounter of 07/19/11 (from the past 24 hour(s))  CBC     Status: Abnormal   Collection Time   07/21/11 12:35 AM      Component Value Range   WBC 6.8  4.0 - 10.5 (K/uL)   RBC 2.92 (*) 3.87 - 5.11 (MIL/uL)   Hemoglobin 8.5 (*) 12.0 - 15.0 (g/dL)   HCT 16.1 (*) 09.6 - 46.0 (%)   MCV 86.6  78.0 - 100.0 (fL)   MCH 29.1  26.0 - 34.0 (pg)   MCHC 33.6  30.0 - 36.0 (g/dL)   RDW 04.5  40.9 - 81.1 (%)   Platelets 219  150 - 400 (K/uL)  COMPREHENSIVE METABOLIC PANEL     Status: Abnormal   Collection Time   07/21/11 12:35 AM      Component Value Range   Sodium 137  135 - 145 (mEq/L)   Potassium 3.9  3.5 - 5.1 (mEq/L)   Chloride 105  96 - 112 (mEq/L)   CO2 27  19 - 32 (mEq/L)     Glucose, Bld 91  70 - 99 (mg/dL)   BUN 4 (*) 6 - 23 (mg/dL)   Creatinine, Ser 9.14  0.50 - 1.10 (mg/dL)   Calcium 8.5  8.4 - 78.2 (mg/dL)   Total Protein 5.3 (*) 6.0 - 8.3 (g/dL)   Albumin 2.2 (*) 3.5 - 5.2 (g/dL)   AST 39 (*) 0 - 37 (U/L)   ALT 21  0 - 35 (U/L)   Alkaline Phosphatase 270 (*) 39 - 117 (U/L)   Total Bilirubin 0.8  0.3 - 1.2 (mg/dL)   GFR calc non Af Amer >90  >90 (mL/min)   GFR calc Af Amer >90  >90 (mL/min)  URIC ACID     Status: Normal   Collection Time   07/21/11 12:35 AM      Component Value Range   Uric Acid, Serum 5.5  2.4 - 7.0 (mg/dL)  LACTATE DEHYDROGENASE     Status: Normal   Collection Time   07/21/11 12:35 AM      Component Value Range   LDH 216  94 - 250 (U/L)  AMYLASE  Status: Normal   Collection Time   07/21/11 12:35 AM      Component Value Range   Amylase 82  0 - 105 (U/L)  LIPASE, BLOOD     Status: Normal   Collection Time   07/21/11 12:35 AM      Component Value Range   Lipase 30  11 - 59 (U/L)   *RADIOLOGY REPORT*  Clinical Data: Right upper quadrant pain with nausea and vomiting.  COMPLETE ABDOMINAL ULTRASOUND  Comparison: None.  Findings:  Gallbladder: There is non-mobile hyperechoic material within the  gallbladder that roughly measures 3.6 x 1.8 x 3.3 cm. There is  some posterior acoustic shadowing associated this echogenic  structure suggesting there may be stones within this area. Negative  for a sonographic Murphy's sign.  Common bile duct: Common bile duct measures 0.3 cm.  Liver: No focal lesion identified. Within normal limits in  parenchymal echogenicity.  IVC: Appears normal.  Pancreas: No focal abnormality seen.  Spleen: Measures 8.6 cm in length.  Right Kidney: Right kidney has a normal appearance. Right kidney  measures 11.1 cm in length without hydronephrosis.  Left Kidney: Normal appearance of the left kidney measuring 11.0  cm. Negative for hydronephrosis.  Abdominal aorta: No aneurysm identified.  IMPRESSION:   There is a focal echogenic structure within the gallbladder which  appears to be non-mobile. This may represent a combination of a  sludge ball with stones.  Original Report Authenticated By: Richarda Overlie, M.D.        Basename 07/21/11 0035 07/20/11 0530  HGB 8.5* 9.2*  HCT 25.3* 27.7*    Assessment/Plan: A:  1. PPD#2       2.  Acute episode of epigastric pain and indigestion last night (does have h/o GERD]       3.  CHTN w/ borderline BP's       4. Obese       5.  Elevated AST       6.  Sludge/indeterminant cholelithiasis on GB u/s last night       7.  Formula feeding       8.  Significant PP anemia  1.  Saline lock IV now, and advance diet.   2.  Cath u/a this AM to r/o protein; [Pt had normal 24 hr urine about 1.5 weeks ago] 3.  Continue Protonix/prn antacids, and if pt stable after lunch, will d/c home 4.  C/w MD if desires repeat PIH labs and r/e BP's   LOS: 2 days   Magaly Pollina H 07/21/2011, 9:55 AM

## 2011-07-25 ENCOUNTER — Telehealth: Payer: Self-pay | Admitting: Obstetrics and Gynecology

## 2011-07-25 NOTE — Telephone Encounter (Signed)
PT CALLED, HAD VAG DELIVERY ON 07/19/11, BP TODAY 150/92, DENIES ANY SXS, IS CURRENTLY ON LABETOLOL 200 MG BID AND HCTZ 25 MG, PT BOTTLE FEEDING, WAS NOT ON MgSO4 WHILE IN HOSPITAL, LAST 24 HR URINE DONE 07/13/11.  PER VPH ADVISED PT TO INCREASE LABETOLOL TO 400MG  BID AND SHAWNDA WILL RE-CHECK PT'S BP ON Monday.  PT VOICES UNDERSTANDING.

## 2011-07-30 ENCOUNTER — Other Ambulatory Visit: Payer: Self-pay

## 2011-07-30 NOTE — Telephone Encounter (Signed)
CONSULT WITH DR ND.  RE  BP.  PT DOES NOT NEED FURTHER F/U UNTIL PP APPT WHICH WAS SCHEDULED 08/30/11.  DOES NOT NEED RF FOR HCTZ. TO CALL IF NEEDS RF LABETALOL. Pt verbalizes comprehension.

## 2011-07-30 NOTE — Telephone Encounter (Signed)
Triage/rx req. °

## 2011-07-30 NOTE — Telephone Encounter (Signed)
TC from [facility], Shawnda.  Pt's BP 134/86.  No PIH Sx.  Is taking  Labetalol 400 mg BID.   Completed HCTZ 07/27/11.  LM for SL, CNM to return call.

## 2011-08-07 ENCOUNTER — Encounter (HOSPITAL_BASED_OUTPATIENT_CLINIC_OR_DEPARTMENT_OTHER): Payer: Self-pay | Admitting: *Deleted

## 2011-08-07 ENCOUNTER — Other Ambulatory Visit: Payer: Self-pay

## 2011-08-07 ENCOUNTER — Encounter (HOSPITAL_COMMUNITY): Payer: Self-pay | Admitting: *Deleted

## 2011-08-07 ENCOUNTER — Emergency Department (HOSPITAL_BASED_OUTPATIENT_CLINIC_OR_DEPARTMENT_OTHER)
Admission: EM | Admit: 2011-08-07 | Discharge: 2011-08-08 | Disposition: A | Discharge status: Home / Self Care | Payer: Medicaid Other | Attending: Emergency Medicine | Admitting: Emergency Medicine

## 2011-08-07 ENCOUNTER — Emergency Department (HOSPITAL_COMMUNITY)
Admission: EM | Admit: 2011-08-07 | Discharge: 2011-08-07 | Discharge status: Against Medical Advice | Payer: Medicaid Other | Source: Home / Self Care

## 2011-08-07 DIAGNOSIS — K8020 Calculus of gallbladder without cholecystitis without obstruction: Secondary | ICD-10-CM | POA: Insufficient documentation

## 2011-08-07 DIAGNOSIS — R079 Chest pain, unspecified: Secondary | ICD-10-CM | POA: Insufficient documentation

## 2011-08-07 DIAGNOSIS — K829 Disease of gallbladder, unspecified: Secondary | ICD-10-CM | POA: Insufficient documentation

## 2011-08-07 DIAGNOSIS — R109 Unspecified abdominal pain: Secondary | ICD-10-CM | POA: Insufficient documentation

## 2011-08-07 DIAGNOSIS — Z79899 Other long term (current) drug therapy: Secondary | ICD-10-CM | POA: Insufficient documentation

## 2011-08-07 DIAGNOSIS — R110 Nausea: Secondary | ICD-10-CM | POA: Insufficient documentation

## 2011-08-07 DIAGNOSIS — K219 Gastro-esophageal reflux disease without esophagitis: Secondary | ICD-10-CM | POA: Insufficient documentation

## 2011-08-07 DIAGNOSIS — O9089 Other complications of the puerperium, not elsewhere classified: Secondary | ICD-10-CM | POA: Insufficient documentation

## 2011-08-07 DIAGNOSIS — R748 Abnormal levels of other serum enzymes: Secondary | ICD-10-CM

## 2011-08-07 DIAGNOSIS — O99893 Other specified diseases and conditions complicating puerperium: Secondary | ICD-10-CM | POA: Insufficient documentation

## 2011-08-07 NOTE — ED Notes (Signed)
Pt. Reports she had a steak and cheese to eat at around 5pm from [facility]

## 2011-08-07 NOTE — ED Notes (Signed)
Patient states chest pain and abdominal pain starting today, patient recently delivered baby and at that time had episode of abdominal pain revealing gallstones.  Patient states symptoms similar to that.

## 2011-08-07 NOTE — ED Notes (Signed)
Pt. Has been at [facility] ED tonight with noted EKG done on Pt.

## 2011-08-08 ENCOUNTER — Emergency Department (HOSPITAL_BASED_OUTPATIENT_CLINIC_OR_DEPARTMENT_OTHER): Payer: Medicaid Other

## 2011-08-08 ENCOUNTER — Encounter (HOSPITAL_BASED_OUTPATIENT_CLINIC_OR_DEPARTMENT_OTHER): Payer: Self-pay | Admitting: *Deleted

## 2011-08-08 LAB — CBC
HCT: 33.7 % — ABNORMAL LOW (ref 36.0–46.0)
Hemoglobin: 11.4 g/dL — ABNORMAL LOW (ref 12.0–15.0)
MCH: 29.2 pg (ref 26.0–34.0)
MCV: 86.2 fL (ref 78.0–100.0)
Platelets: 371 10*3/uL (ref 150–400)
RBC: 3.91 MIL/uL (ref 3.87–5.11)
WBC: 6.7 10*3/uL (ref 4.0–10.5)

## 2011-08-08 LAB — COMPREHENSIVE METABOLIC PANEL
AST: 158 U/L — ABNORMAL HIGH (ref 0–37)
CO2: 28 mEq/L (ref 19–32)
Calcium: 9.3 mg/dL (ref 8.4–10.5)
Chloride: 105 mEq/L (ref 96–112)
Creatinine, Ser: 0.9 mg/dL (ref 0.50–1.10)
GFR calc Af Amer: >90 mL/min (ref >90)
GFR calc non Af Amer: 87 mL/min — ABNORMAL LOW (ref >90)
Glucose, Bld: 111 mg/dL — ABNORMAL HIGH (ref 70–99)
Total Bilirubin: 0.7 mg/dL (ref 0.3–1.2)

## 2011-08-08 MED ORDER — HYDROCODONE-ACETAMINOPHEN 5-325 MG PO TABS: ORAL_TABLET | ORAL | Status: DC

## 2011-08-08 MED ORDER — MORPHINE SULFATE 4 MG/ML IJ SOLN
6.0000 mg | Freq: Once | INTRAMUSCULAR | Status: AC
  Administered 2011-08-08: 6 mg via INTRAVENOUS
  Filled 2011-08-08: qty 2

## 2011-08-08 MED ORDER — ONDANSETRON 8 MG PO TBDP: 8.0000 mg | ORAL_TABLET | Freq: Three times a day (TID) | ORAL | Status: AC | PRN

## 2011-08-08 MED ORDER — ONDANSETRON HCL 4 MG/2ML IJ SOLN
4.0000 mg | Freq: Once | INTRAMUSCULAR | Status: AC
  Administered 2011-08-08: 4 mg via INTRAVENOUS
  Filled 2011-08-08: qty 2

## 2011-08-08 MED ORDER — SODIUM CHLORIDE 0.9 % IV BOLUS (SEPSIS)
1000.0000 mL | Freq: Once | INTRAVENOUS | Status: AC
  Administered 2011-08-08: 1000 mL via INTRAVENOUS

## 2011-08-08 NOTE — ED Provider Notes (Signed)
Patient continued to feel well. Ultrasound returned with only cholelithiasis and no evidence of acute cholecystitis. Patient was discharged with short course of Zofran and Vicodin and encouraged to followup with surgery. She stated she would call following discharge. Patient was discharged in good condition.  1. Cholelithiasis   2. Elevated liver enzymes      Cyndra Numbers, MD 08/08/11 [phone]

## 2011-08-08 NOTE — ED Provider Notes (Signed)
History     CSN: [id number]  Arrival date & time 08/07/11  2336   First MD Initiated Contact with Patient 08/07/11 2346      Chief Complaint  Patient presents with  . Chest Pain    Pt. reports she has history of acid reflux.  Pt. reports burping with no vomiting.  Some nausea per Pt.     The history is provided by the patient.   the patient is 2 weeks postpartum from a term vaginal delivery.  This was an uncomplicated delivery.  The patient presents the emergency department today with development of epigastric and right upper quadrant abdominal pain associated with nausea.  She has no evidence of vomiting.  She had similar symptoms just prior to delivery of her child and at that time she was noted to have cholelithiasis on ultrasound.  She was never referred to the general surgeons.  She reports she's been doing fine over the past 2 weeks and for this evening.  She reports her pain began approximately an hour and a half after eating this evening.  Her pain at this time is moderate to severe.  She still nauseated.  She reports decreased mild intake over the past several hours.  Her symptoms are worsened by palpation.  Nothing improves her symptoms.  Her symptoms are constant.  She denies fevers or chills.  She has no dysuria or urinary frequency.  She reports the pain radiates up into her chest.  She denies no shortness of breath or cough.  Past Medical History  Diagnosis Date  . H/O varicella   . H/O cystitis   . Yeast infection   . GERD (gastroesophageal reflux disease)   . Hypertension     no meds  . Postpartum anemia 07/21/2011    Hgb=8.4 on 6/8 (11.1) on admission  . UTI (urinary tract infection) following delivery 07/21/2011    Cath u/a with positive nitrites 07/21/11; per c/w Dr. Estanislado Pandy, will treat w/ Macrobid po bid x7 day.  Culture sent.  . Elevated AST (SGOT)     07/21/11=39; previously normal on admission and 07/10/11    Past Surgical History  Procedure Date  . No past surgeries      Family History  Problem Relation Age of Onset  . Hypertension Mother   . Stroke Maternal Uncle   . Diabetes Maternal Grandmother   . Hypertension Maternal Grandfather   . Mental illness Paternal Grandfather   . Asthma Cousin   . Anesthesia problems Neg Hx   . Hypotension Neg Hx   . Malignant hyperthermia Neg Hx   . Pseudochol deficiency Neg Hx     History  Substance Use Topics  . Smoking status: Never Smoker   . Smokeless tobacco: Never Used  . Alcohol Use: No    OB History    Grav Para Term Preterm Abortions TAB SAB Ect Mult Living   1 1 1       1       Review of Systems  Cardiovascular: Positive for chest pain.  All other systems reviewed and are negative.    Allergies  Pollen extract  Home Medications   Current Outpatient Rx  Name Route Sig Dispense Refill  . FERRALET 90 90-1 MG PO TABS Oral Take 1 capsule by mouth daily. 30 each 11  . HYDROCHLOROTHIAZIDE 25 MG PO TABS Oral Take 1 tablet (25 mg total) by mouth daily. 7 tablet 0    Take for 1 week  . LABETALOL HCL 200  MG PO TABS Oral Take 1 tablet (200 mg total) by mouth 2 (two) times daily. 60 tablet 2  . PANTOPRAZOLE SODIUM 40 MG PO TBEC Oral Take 1 tablet (40 mg total) by mouth daily at 12 noon. 30 tablet 2  . PRENATAL MULTIVITAMIN CH Oral Take 1 tablet by mouth at bedtime.       BP 144/82  Pulse 80  Temp 97.6 F (36.4 C)  Resp 18  SpO2 100%  LMP 11/07/2010  Breastfeeding? No  Physical Exam  Nursing note and vitals reviewed. Constitutional: She is oriented to person, place, and time. She appears well-developed and well-nourished. No distress.  HENT:  Head: Normocephalic and atraumatic.  Eyes: EOM are normal.  Neck: Normal range of motion.  Cardiovascular: Normal rate, regular rhythm and normal heart sounds.   Pulmonary/Chest: Effort normal and breath sounds normal.  Abdominal: Soft. She exhibits no distension.       Epigastric and right upper quadrant tenderness without guarding or rebound    Musculoskeletal: Normal range of motion.  Neurological: She is alert and oriented to person, place, and time.  Skin: Skin is warm and dry.  Psychiatric: She has a normal mood and affect. Judgment normal.    ED Course  Procedures (including critical care time)  Labs Reviewed  CBC - Abnormal; Notable for the following:    Hemoglobin 11.4 (*)     HCT 33.7 (*)     All other components within normal limits  COMPREHENSIVE METABOLIC PANEL - Abnormal; Notable for the following:    Glucose, Bld 111 (*)     AST 158 (*)     ALT 66 (*)     Alkaline Phosphatase 191 (*)     GFR calc non Af Amer 87 (*)     All other components within normal limits  LIPASE, BLOOD   No results found.   No diagnosis found.    MDM  The patient's pain is significantly improved in the emergency department.  She has known cholelithiasis and her symptoms today are consistent with biliary colic.  Her white count is normal and my suspicion for cholecystitis is low however the patient does have a new elevation in her AST and ALT with a normal lipase and thus the patient will require repeat ultrasound imaging to evaluate further.  Given the patient's pain was significantly improved I did not feel as though the patient needed the imaging him urgently but I did think that she would benefit from this in an urgent fashion.  I offered to transfer the patient to [facility] have an ultrasound versus cyst in the emergency department throughout the night and obtain ultrasound first thing in the morning.  She preferred to stay in the emergency department and receive her ultrasound here.  She is continued to remain comfortable throughout the night an ultrasound will be obtained this point.  The patient will require general surgery follow for Thursday on the ultrasound is normal as she does have symptomatic biliary colic        Lyanne Co, MD 08/08/11 [phone]

## 2011-08-09 ENCOUNTER — Telehealth: Payer: Self-pay

## 2011-08-09 NOTE — Telephone Encounter (Signed)
Tc from pt. Pt wants vph to be aware that she went to the ER on 08/07/11. She was dx with gallstones and dec liver enzymes. Pt has surgery sched 09/06/11 with [facility] for removal of gallstones. Will make vph aware. Pt voices understanding.

## 2011-08-17 ENCOUNTER — Emergency Department (HOSPITAL_BASED_OUTPATIENT_CLINIC_OR_DEPARTMENT_OTHER)
Admission: EM | Admit: 2011-08-17 | Discharge: 2011-08-17 | Disposition: A | Discharge status: Home / Self Care | Payer: Medicaid Other | Attending: Emergency Medicine | Admitting: Emergency Medicine

## 2011-08-17 ENCOUNTER — Emergency Department (HOSPITAL_BASED_OUTPATIENT_CLINIC_OR_DEPARTMENT_OTHER): Payer: Medicaid Other

## 2011-08-17 ENCOUNTER — Encounter (HOSPITAL_BASED_OUTPATIENT_CLINIC_OR_DEPARTMENT_OTHER): Payer: Self-pay | Admitting: *Deleted

## 2011-08-17 DIAGNOSIS — K8020 Calculus of gallbladder without cholecystitis without obstruction: Secondary | ICD-10-CM | POA: Insufficient documentation

## 2011-08-17 DIAGNOSIS — K219 Gastro-esophageal reflux disease without esophagitis: Secondary | ICD-10-CM | POA: Insufficient documentation

## 2011-08-17 LAB — DIFFERENTIAL
Basophils Absolute: 0 10*3/uL (ref 0.0–0.1)
Eosinophils Absolute: 0.1 10*3/uL (ref 0.0–0.7)
Eosinophils Relative: 3 % (ref 0–5)
Lymphocytes Relative: 49 % — ABNORMAL HIGH (ref 12–46)
Neutrophils Relative %: 40 % — ABNORMAL LOW (ref 43–77)

## 2011-08-17 LAB — URINALYSIS, ROUTINE W REFLEX MICROSCOPIC
Glucose, UA: NEGATIVE mg/dL
Specific Gravity, Urine: 1.029 (ref 1.005–1.030)
Urobilinogen, UA: 0.2 mg/dL (ref 0.0–1.0)
pH: 5.5 (ref 5.0–8.0)

## 2011-08-17 LAB — COMPREHENSIVE METABOLIC PANEL
ALT: 11 U/L (ref 0–35)
AST: 17 U/L (ref 0–37)
Albumin: 3.4 g/dL — ABNORMAL LOW (ref 3.5–5.2)
Alkaline Phosphatase: 134 U/L — ABNORMAL HIGH (ref 39–117)
Calcium: 8.8 mg/dL (ref 8.4–10.5)
Potassium: 3.6 mEq/L (ref 3.5–5.1)
Sodium: 140 mEq/L (ref 135–145)
Total Protein: 7.2 g/dL (ref 6.0–8.3)

## 2011-08-17 LAB — CBC
MCH: 28.9 pg (ref 26.0–34.0)
MCV: 84.2 fL (ref 78.0–100.0)
Platelets: 358 10*3/uL (ref 150–400)
RDW: 13.2 % (ref 11.5–15.5)
WBC: 4.1 10*3/uL (ref 4.0–10.5)

## 2011-08-17 LAB — URINE MICROSCOPIC-ADD ON

## 2011-08-17 LAB — PREGNANCY, URINE: Preg Test, Ur: NEGATIVE

## 2011-08-17 MED ORDER — ONDANSETRON HCL 4 MG/2ML IJ SOLN
4.0000 mg | Freq: Once | INTRAMUSCULAR | Status: AC
  Administered 2011-08-17: 4 mg via INTRAVENOUS
  Filled 2011-08-17: qty 2

## 2011-08-17 MED ORDER — SODIUM CHLORIDE 0.9 % IV BOLUS (SEPSIS)
1000.0000 mL | Freq: Once | INTRAVENOUS | Status: AC
  Administered 2011-08-17: 1000 mL via INTRAVENOUS

## 2011-08-17 MED ORDER — HYDROMORPHONE HCL PF 1 MG/ML IJ SOLN
1.0000 mg | Freq: Once | INTRAMUSCULAR | Status: AC
  Administered 2011-08-17: 1 mg via INTRAVENOUS
  Filled 2011-08-17: qty 1

## 2011-08-17 NOTE — ED Notes (Signed)
Epigastric pain. States she was diagnosed with gallstones and has an appointment to see a Careers adviser on July 25th.

## 2011-08-17 NOTE — ED Provider Notes (Signed)
History     CSN: [id number]  Arrival date & time 08/17/11  7829   First MD Initiated Contact with Patient 08/17/11 1903      Chief Complaint  Patient presents with  . Abdominal Pain    (Consider location/radiation/quality/duration/timing/severity/associated sxs/prior treatment) HPI Patient is a 26 year old female who presents today complaining of 8/10 epigastric abdominal pain. Patient has recent history of cholelithiasis with elevated liver function. Patient is actually scheduled to followup with surgery on July 25. She was discharged previously with pain medication and took one dose of this at home this morning. Patient endorses some nausea and an episode of vomiting. She denies any fevers. Patient denies any urinary symptoms. She is status post delivery of the baby on June 6 and does continued to have vaginal discharge but has noticed nothing unusual with this and has been followed up since delivery. There are no other associated or modifying factors. Patient cannot think of anything that made her pain better or worse. There's no radiation. Past Medical History  Diagnosis Date  . H/O varicella   . H/O cystitis   . Yeast infection   . GERD (gastroesophageal reflux disease)   . Hypertension     no meds  . Postpartum anemia 07/21/2011    Hgb=8.4 on 6/8 (11.1) on admission  . UTI (urinary tract infection) following delivery 07/21/2011    Cath u/a with positive nitrites 07/21/11; per c/w Dr. Estanislado Pandy, will treat w/ Macrobid po bid x7 day.  Culture sent.  . Elevated AST (SGOT)     07/21/11=39; previously normal on admission and 07/10/11    Past Surgical History  Procedure Date  . No past surgeries     Family History  Problem Relation Age of Onset  . Hypertension Mother   . Stroke Maternal Uncle   . Diabetes Maternal Grandmother   . Hypertension Maternal Grandfather   . Mental illness Paternal Grandfather   . Asthma Cousin   . Anesthesia problems Neg Hx   . Hypotension Neg Hx   .  Malignant hyperthermia Neg Hx   . Pseudochol deficiency Neg Hx     History  Substance Use Topics  . Smoking status: Never Smoker   . Smokeless tobacco: Never Used  . Alcohol Use: No    OB History    Grav Para Term Preterm Abortions TAB SAB Ect Mult Living   1 1 1       1       Review of Systems  Constitutional: Negative.   HENT: Negative.   Eyes: Negative.   Respiratory: Negative.   Cardiovascular: Negative.   Gastrointestinal: Positive for nausea, vomiting and abdominal pain.  Genitourinary: Negative.   Musculoskeletal: Negative.   Skin: Negative.   Neurological: Negative.   Hematological: Negative.   Psychiatric/Behavioral: Negative.   All other systems reviewed and are negative.    Allergies  Pollen extract  Home Medications   Current Outpatient Rx  Name Route Sig Dispense Refill  . FERRALET 90 90-1 MG PO TABS Oral Take 1 capsule by mouth daily. 30 each 11  . HYDROCHLOROTHIAZIDE 25 MG PO TABS Oral Take 1 tablet (25 mg total) by mouth daily. 7 tablet 0    Take for 1 week  . HYDROCODONE-ACETAMINOPHEN 5-325 MG PO TABS  Take 1-2 tablets by mouth every 6 hours as needed for pain. 10 tablet 0  . LABETALOL HCL 200 MG PO TABS Oral Take 1 tablet (200 mg total) by mouth 2 (two) times daily. 60 tablet 2  .  PANTOPRAZOLE SODIUM 40 MG PO TBEC Oral Take 1 tablet (40 mg total) by mouth daily at 12 noon. 30 tablet 2    BP 136/82  Pulse 78  Temp 97.9 F (36.6 C) (Oral)  Resp 20  SpO2 100%  LMP 11/07/2010  Physical Exam  Nursing note and vitals reviewed. GEN: Well-developed, well-nourished female in no distress HEENT: Atraumatic, normocephalic. Oropharynx clear without erythema EYES: PERRLA BL, no scleral icterus. NECK: Trachea midline, no meningismus CV: regular rate and rhythm. No murmurs, rubs, or gallops PULM: No respiratory distress.  No crackles, wheezes, or rales. GI: soft, epigastric tenderness to palpation. No guarding, rebound. + bowel sounds  GU:  deferred Neuro: cranial nerves grossly 2-12 intact, no abnormalities of strength or sensation, A and O x 3 MSK: Patient moves all 4 extremities symmetrically, no deformity, edema, or injury noted Skin: No rashes petechiae, purpura, or jaundice Psych: no abnormality of mood   ED Course  Procedures (including critical care time)  Labs Reviewed  CBC - Abnormal; Notable for the following:    Hemoglobin 11.2 (*)     HCT 32.6 (*)     All other components within normal limits  DIFFERENTIAL - Abnormal; Notable for the following:    Neutrophils Relative 40 (*)     Lymphocytes Relative 49 (*)     All other components within normal limits  COMPREHENSIVE METABOLIC PANEL - Abnormal; Notable for the following:    Albumin 3.4 (*)     Alkaline Phosphatase 134 (*)     GFR calc non Af Amer 87 (*)     All other components within normal limits  URINALYSIS, ROUTINE W REFLEX MICROSCOPIC - Abnormal; Notable for the following:    APPearance CLOUDY (*)     Hgb urine dipstick MODERATE (*)     Ketones, ur 15 (*)     Leukocytes, UA LARGE (*)     All other components within normal limits  URINE MICROSCOPIC-ADD ON - Abnormal; Notable for the following:    Squamous Epithelial / LPF MANY (*)     Bacteria, UA FEW (*)     All other components within normal limits  LIPASE, BLOOD  PREGNANCY, URINE   US Abdomen Complete  08/17/2011  *RADIOLOGY REPORT*  Clinical Data:  Epigastric abdominal pain.  Previously diagnosed cholelithiasis.  COMPLETE ABDOMINAL ULTRASOUND  Comparison:  Previous examinations, the most recent dated 08/08/2011.  Findings:  Gallbladder:  Multiple small gallstones are again demonstrated with the largest individual stone measuring approximately 6 mm in maximum diameter.  No gallbladder wall thickening or pericholecystic fluid.  The patient was not focally tender over the gallbladder.  Common bile duct:  Normal in caliber, measuring 2.1 mm in diameter proximally.  Liver:  Mildly diffusely echogenic.   IVC:  Appears normal.  Pancreas:  No focal abnormality seen.  Spleen:  Normal, measuring 7.0 cm in length.  Right Kidney:  Normal, measuring 8.5 cm in length.  Left Kidney:  Normal, measuring 10.6 cm in length.  Abdominal aorta:  No aneurysm identified.  IMPRESSION:  1.  Stable cholelithiasis without evidence of cholecystitis. 2.  Stable mildly echogenic liver, suggesting hepatic steatosis.  Original Report Authenticated By: Darrol Angel, M.D.     1. Symptomatic cholelithiasis       MDM  Patient was evaluated by myself. Based on evaluation patient did have repeat laboratory workup for abdominal pain and an ultrasound of the abdomen was performed. Her pain was treated with normal saline IV bolus as  well as a dose of Zofran and Dilaudid. Patient did not have elevated white count or any compromise of her liver function today. Patient did have some findings on her urine but this is explained by her vaginal discharge since delivery of her child. Patient was not pregnant today. Ultrasound showed no signs of choledocholithiasis or cholecystitis. Cholelithiasis was seen once again. Patient did not require any additional medications. She still has pain and nausea medications at home from previous visit. She has followup scheduled and was advised to call to see if an earlier appointment might be available that was told that it was not necessary that she be seen in the earlier. Patient was discharged in good condition. She develops fevers symptoms uncontrolled by home medications, or worsening pain she is welcome to return. Patient was discharged in good condition.        Cyndra Numbers, MD 08/17/11 2233

## 2011-08-18 ENCOUNTER — Encounter (HOSPITAL_BASED_OUTPATIENT_CLINIC_OR_DEPARTMENT_OTHER): Payer: Self-pay | Admitting: Emergency Medicine

## 2011-08-18 ENCOUNTER — Emergency Department (HOSPITAL_BASED_OUTPATIENT_CLINIC_OR_DEPARTMENT_OTHER)
Admission: EM | Admit: 2011-08-18 | Discharge: 2011-08-18 | Disposition: A | Discharge status: Home / Self Care | Payer: Medicaid Other | Attending: Emergency Medicine | Admitting: Emergency Medicine

## 2011-08-18 DIAGNOSIS — Z823 Family history of stroke: Secondary | ICD-10-CM | POA: Insufficient documentation

## 2011-08-18 DIAGNOSIS — K8050 Calculus of bile duct without cholangitis or cholecystitis without obstruction: Secondary | ICD-10-CM

## 2011-08-18 DIAGNOSIS — R1110 Vomiting, unspecified: Secondary | ICD-10-CM | POA: Insufficient documentation

## 2011-08-18 DIAGNOSIS — Z87440 Personal history of urinary (tract) infections: Secondary | ICD-10-CM | POA: Insufficient documentation

## 2011-08-18 DIAGNOSIS — Z8249 Family history of ischemic heart disease and other diseases of the circulatory system: Secondary | ICD-10-CM | POA: Insufficient documentation

## 2011-08-18 DIAGNOSIS — I10 Essential (primary) hypertension: Secondary | ICD-10-CM | POA: Insufficient documentation

## 2011-08-18 DIAGNOSIS — K219 Gastro-esophageal reflux disease without esophagitis: Secondary | ICD-10-CM | POA: Insufficient documentation

## 2011-08-18 DIAGNOSIS — Z833 Family history of diabetes mellitus: Secondary | ICD-10-CM | POA: Insufficient documentation

## 2011-08-18 DIAGNOSIS — R109 Unspecified abdominal pain: Secondary | ICD-10-CM | POA: Insufficient documentation

## 2011-08-18 MED ORDER — DICYCLOMINE HCL 10 MG PO CAPS
ORAL_CAPSULE | ORAL | Status: AC
  Administered 2011-08-18: 20 mg
  Filled 2011-08-18: qty 2

## 2011-08-18 MED ORDER — DICYCLOMINE HCL 20 MG PO TABS: ORAL_TABLET | ORAL | Status: DC

## 2011-08-18 MED ORDER — OXYCODONE-ACETAMINOPHEN 5-325 MG PO TABS
2.0000 | ORAL_TABLET | Freq: Once | ORAL | Status: AC
  Administered 2011-08-18: 2 via ORAL
  Filled 2011-08-18: qty 2

## 2011-08-18 MED ORDER — DICYCLOMINE HCL 20 MG PO TABS
20.0000 mg | ORAL_TABLET | Freq: Once | ORAL | Status: DC
  Filled 2011-08-18: qty 1

## 2011-08-18 NOTE — ED Notes (Signed)
States was seen yesterday for abdominal pain due to gallstones.  Reports the pain medication she was given is not helping.

## 2011-08-18 NOTE — ED Provider Notes (Signed)
History     CSN: [id number]  Arrival date & time 08/18/11  0259   First MD Initiated Contact with Patient 08/18/11 [phone]      Chief Complaint  Patient presents with  . Abdominal Pain    (Consider location/radiation/quality/duration/timing/severity/associated sxs/prior treatment) HPI This is a 26 year old black female recently postpartum who was recently diagnosed with cholelithiasis and biliary colic. She was seen here yesterday evening for continued pain. She was treated with Dilaudid and Zofran for her acute symptoms. She states that since being discharged she has had a return of pain, 8/10, in the right upper quadrant consistent with previous symptoms. It is noted that her ultrasound yesterday evening showed no evidence of choledocholithiasis or cholecystitis. She states the hydrocodone she has at home has not adequately treated the pain. Her pain was associated with 2 episodes of emesis earlier. She has not been treated with antispasmodic. She is not breast-feeding.  Past Medical History  Diagnosis Date  . H/O varicella   . H/O cystitis   . Yeast infection   . GERD (gastroesophageal reflux disease)   . Hypertension     no meds  . Postpartum anemia 07/21/2011    Hgb=8.4 on 6/8 (11.1) on admission  . UTI (urinary tract infection) following delivery 07/21/2011    Cath u/a with positive nitrites 07/21/11; per c/w Dr. Estanislado Pandy, will treat w/ Macrobid po bid x7 day.  Culture sent.  . Elevated AST (SGOT)     07/21/11=39; previously normal on admission and 07/10/11    Past Surgical History  Procedure Date  . No past surgeries     Family History  Problem Relation Age of Onset  . Hypertension Mother   . Stroke Maternal Uncle   . Diabetes Maternal Grandmother   . Hypertension Maternal Grandfather   . Mental illness Paternal Grandfather   . Asthma Cousin   . Anesthesia problems Neg Hx   . Hypotension Neg Hx   . Malignant hyperthermia Neg Hx   . Pseudochol deficiency Neg Hx     History   Substance Use Topics  . Smoking status: Never Smoker   . Smokeless tobacco: Never Used  . Alcohol Use: No    OB History    Grav Para Term Preterm Abortions TAB SAB Ect Mult Living   1 1 1       1       Review of Systems  All other systems reviewed and are negative.    Allergies  Pollen extract  Home Medications   Current Outpatient Rx  Name Route Sig Dispense Refill  . FERRALET 90 90-1 MG PO TABS Oral Take 1 capsule by mouth daily. 30 each 11  . HYDROCHLOROTHIAZIDE 25 MG PO TABS Oral Take 1 tablet (25 mg total) by mouth daily. 7 tablet 0    Take for 1 week  . HYDROCODONE-ACETAMINOPHEN 5-325 MG PO TABS  Take 1-2 tablets by mouth every 6 hours as needed for pain. 10 tablet 0  . LABETALOL HCL 200 MG PO TABS Oral Take 1 tablet (200 mg total) by mouth 2 (two) times daily. 60 tablet 2  . PANTOPRAZOLE SODIUM 40 MG PO TBEC Oral Take 1 tablet (40 mg total) by mouth daily at 12 noon. 30 tablet 2    BP 133/77  Pulse 73  Temp 97.8 F (36.6 C) (Oral)  Resp 16  Ht 5\' 5"  (1.651 m)  Wt 222 lb 14.4 oz (101.107 kg)  BMI 37.09 kg/m2  SpO2 100%  LMP 11/07/2010  Physical Exam General: Well-developed, well-nourished female in no acute distress; appearance consistent with age of record HENT: normocephalic, atraumatic Eyes: pupils equal round and reactive to light; extraocular muscles intact Neck: supple Heart: regular rate and rhythm Lungs: clear to auscultation bilaterally Abdomen: soft; nondistended; right upper quadrant tenderness; no masses or hepatosplenomegaly; bowel sounds present Extremities: No deformity; full range of motion Neurologic: Awake, alert and oriented; motor function intact in all extremities and symmetric; no facial droop Skin: Warm and dry Psychiatric: Normal mood and affect    ED Course  Procedures (including critical care time)     MDM  We will add antispasmodic. The patient has an appointment with [facility] in 3  days.        Hanley Seamen, MD 08/18/11 [phone]

## 2011-08-22 NOTE — Discharge Summary (Signed)
Obstetric Discharge Summary Reason for Admission: rupture of membranes Prenatal Procedures: ultrasound Intrapartum Procedures: spontaneous vaginal delivery, GBS prophylaxis and epidural Postpartum Procedures: abdominal u/s; PIH labs with amylase & lipase; cath u/a Complications-Operative and Postpartum: 1st degree perineal laceration  Hospital Course: Pt admitted on 07/19/11 for Prelabor ROM at 0400 07/19/11.  Cx 3.5/100/0 on admission.  Pitocin started after AM shift change.  No prior meds for CHTN, and BP elevated on admission.  Received epidural around 1615.  At 1800, cx=8/100% & IUPC inserted.  BP normal after epdiral.  Pt complete at 2020, and SVD at 2303 07/19/11.  Cytotec given just after delivery for increased bleeding.  On evening of 6/7, pt had episode of n/v, severe epigastric pain w/ radiating pains to back.  Po antacids given and other comfort measures w/o relief, and PIH labs along w/ amylase & lipase drawn, as well as abdominal u/s.  U/s noted sludge, but indeterminate r/e cholelithiasis.  Pt kept NPO and given IVH that night, before advancing diet morning of 6/8.  Pt was struggling passing gas, but BM's had resumed.  AST slightly elevated =39.  BP remained elevated thereafter, and in light of history and significant LE edema, pt placed on Labetalol 200mg  po bid and HCTZ 25 mg po qd.  Pt was Formula- feeding.  She was up ad lib despite worsening anemia and Hgb reached a min=8.2. Pt transitioned diet and felt better as day progressed on 6/8, and per c/w dr. Estanislado Pandy, was d/c'd home in stable condition.    Discharge Diagnoses: Term Pregnancy-delivered and Prelabor ROM; CHTN w/ transient elevated BPs during hospitalization (no previous use of meds); obese; GERD; Persisting anemia; Possible cholelithiasis/sludge on u/s following episode of epigastric pain & n/v.  Discharge Information: Date: 07/21/11 PPD#2 Activity: pelvic rest Diet: iron-rich Medications: PNV, Ibuprofen and Ferralet 90;  Labetalol 200mg  po bid; Hctz 25 mg  po qd x7d;  Condition: stable Instructions: refer to practice specific booklet Discharge to: home Follow-up Information    Follow up with [facility]. Schedule an appointment as soon as possible for a visit in 6 weeks. (or call as needed with any questions or concerns)       Follow up with [facility]. (Will have RN come for home visit to check your BP  and edema either Wed or Thursday next week)          Newborn Data: Live born female (delivery provider:  Sanda Klein, CNM) Birth Weight: 7 lb 0.2 oz (3181 g) APGAR: 8, 9  Home with mother.  Elsey Holts H 08/22/2011, 10:00 PM

## 2011-08-24 ENCOUNTER — Ambulatory Visit (INDEPENDENT_AMBULATORY_CARE_PROVIDER_SITE_OTHER): Payer: Medicaid Other | Admitting: General Surgery

## 2011-08-30 ENCOUNTER — Ambulatory Visit: Payer: Medicaid Other | Admitting: Obstetrics and Gynecology

## 2011-09-06 ENCOUNTER — Ambulatory Visit (INDEPENDENT_AMBULATORY_CARE_PROVIDER_SITE_OTHER): Payer: Medicaid Other | Admitting: Surgery

## 2011-09-11 DIAGNOSIS — K8020 Calculus of gallbladder without cholecystitis without obstruction: Secondary | ICD-10-CM | POA: Insufficient documentation

## 2011-09-13 HISTORY — PX: CHOLECYSTECTOMY: SHX55

## 2012-10-20 IMAGING — US US ABDOMEN COMPLETE
1 series · 14 of 25 positions shown · non-contrast
Comparison: None.

CLINICAL DATA: Right upper quadrant pain with nausea and vomiting.

COMPLETE ABDOMINAL ULTRASOUND

[Series 1: us abdomen complete · 14 of 81 slices shown]
[im 1/81]
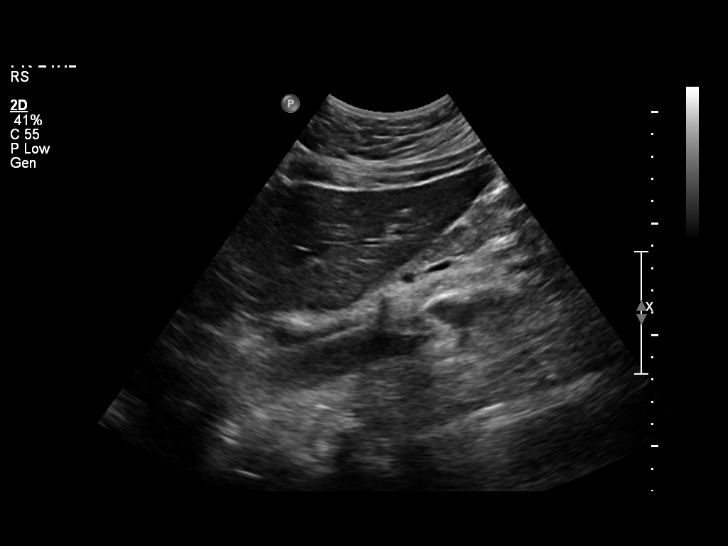
[im 7/81]
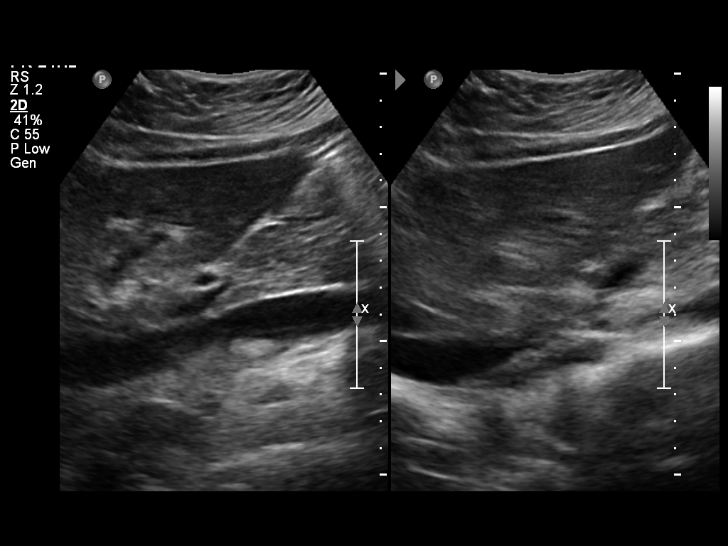
[im 14/81]
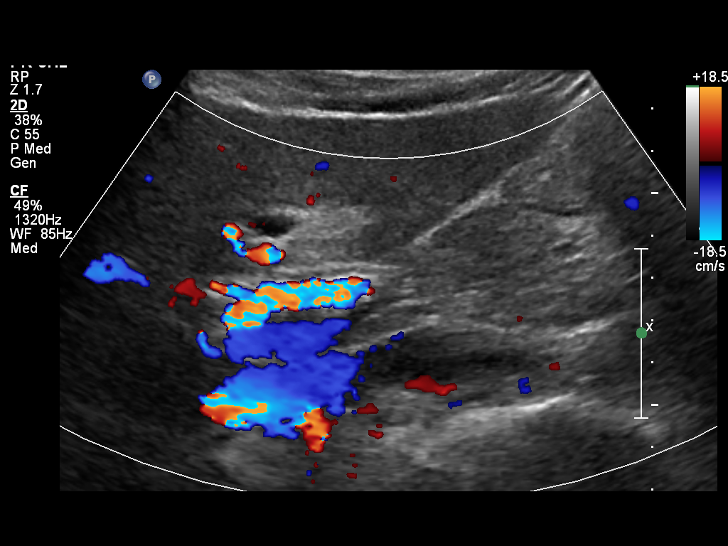
[im 21/81]
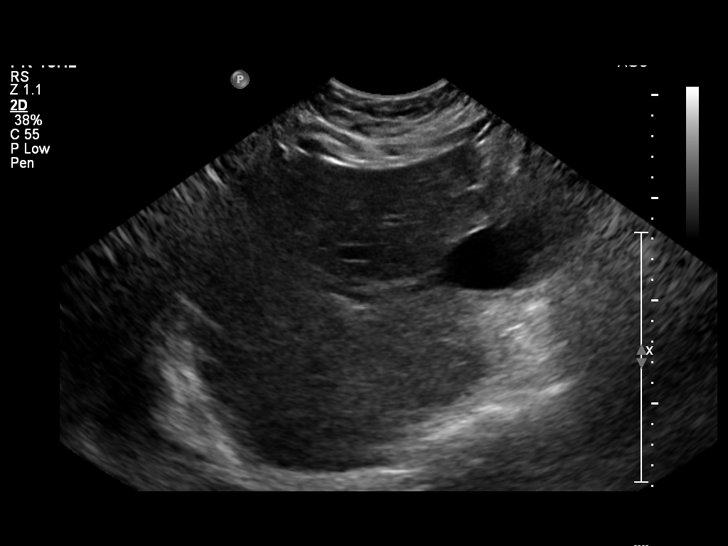
[im 27/81]
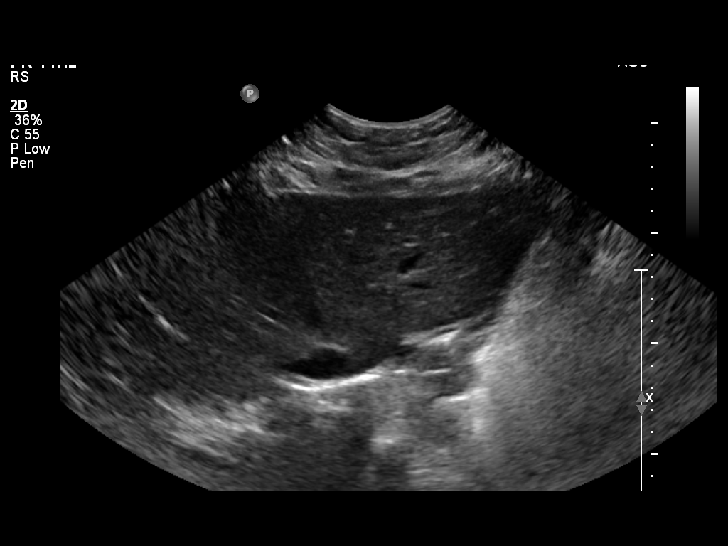
[im 31/81]
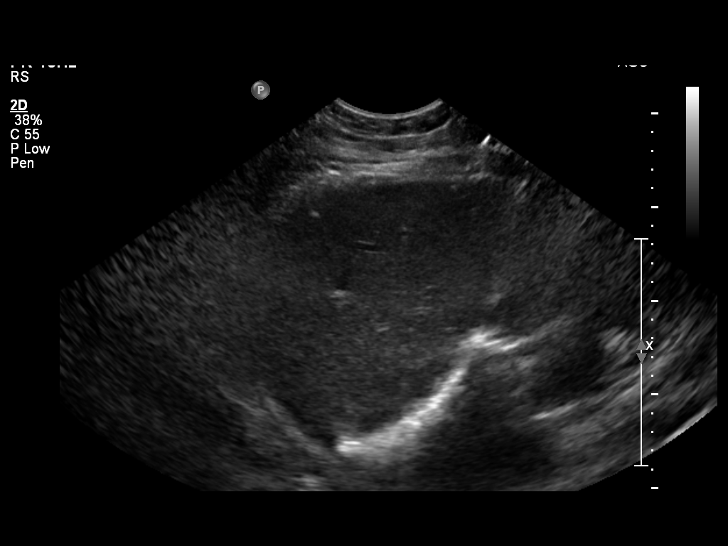
[im 37/81]
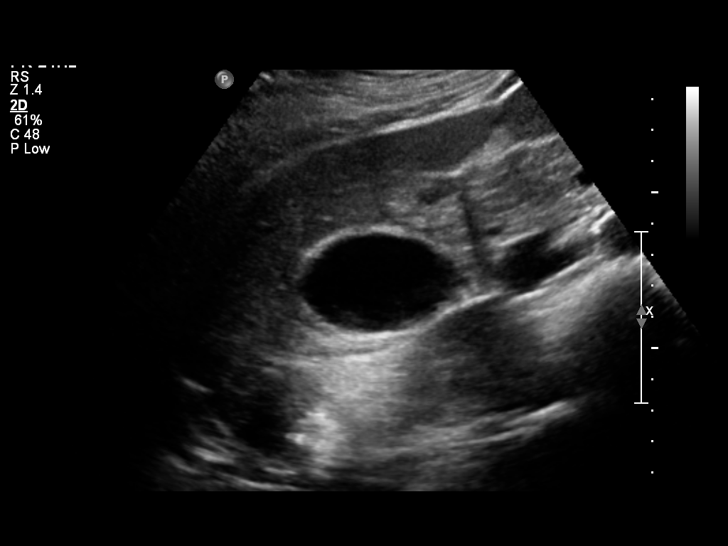
[im 44/81]
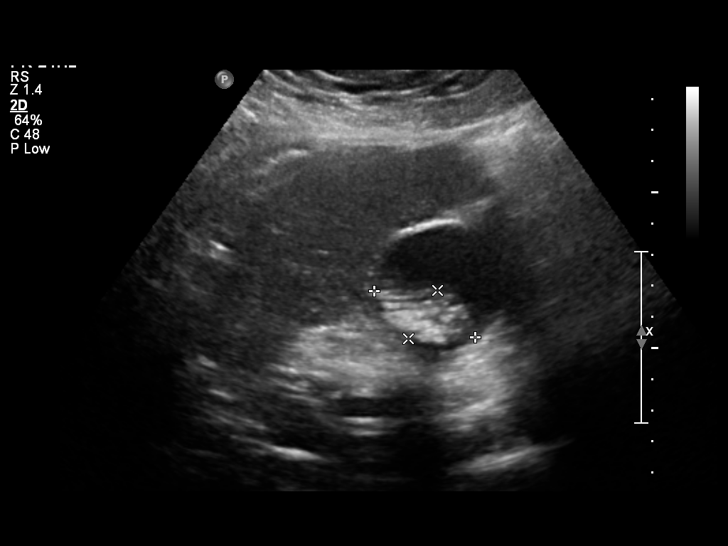
[im 51/81]
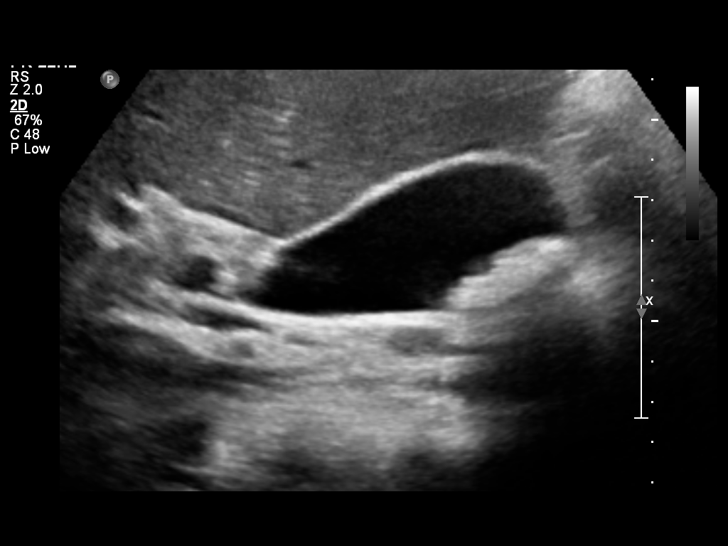
[im 54/81]
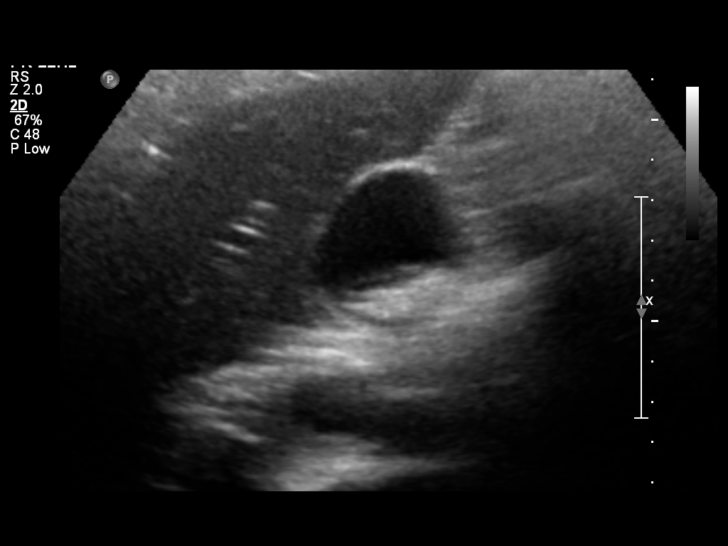
[im 61/81]
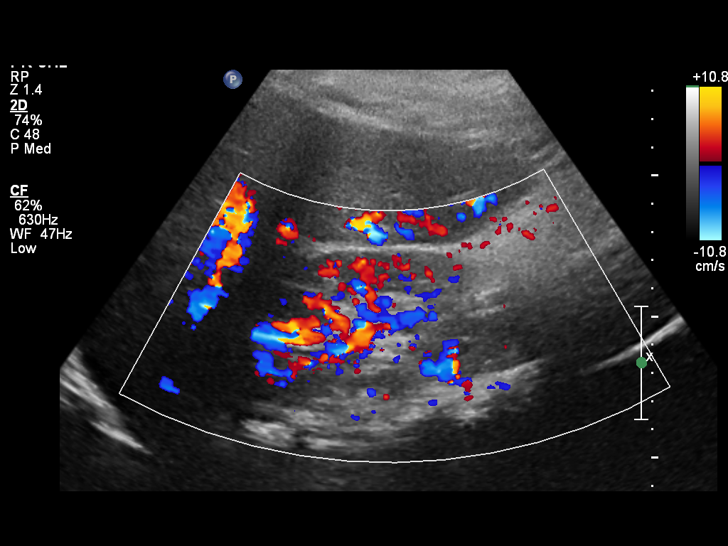
[im 67/81]
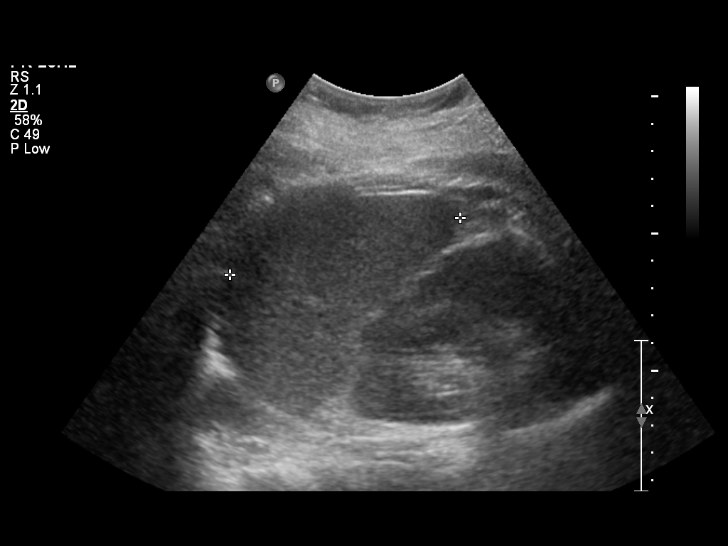
[im 74/81]
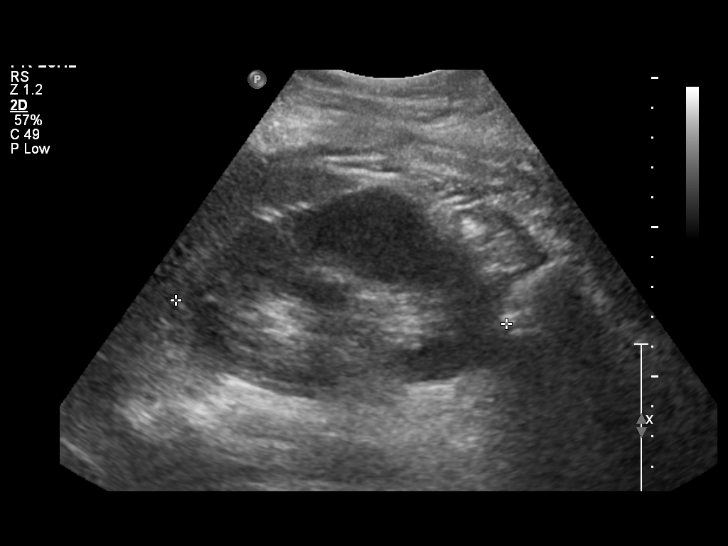
[im 81/81]
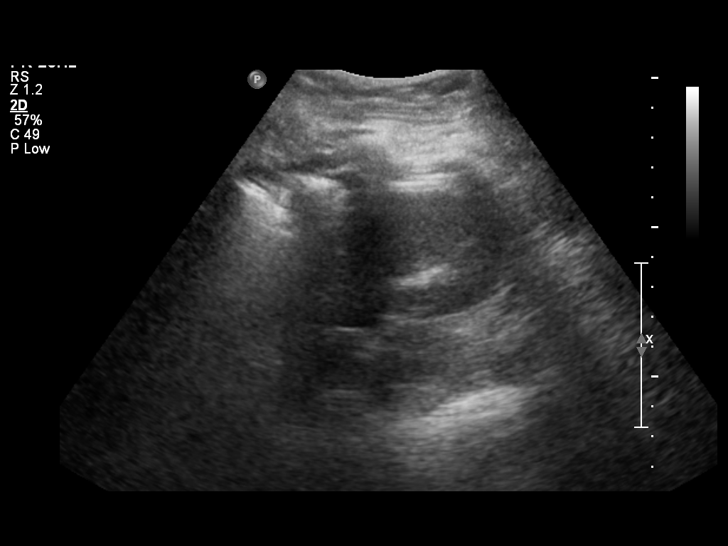

[14 of 25 positions shown; findings below may reference images not displayed]

FINDINGS: Gallbladder:  There is non-mobile hyperechoic material within the
gallbladder that roughly measures 3.6 x 1.8 x 3.3 cm.  There is
some posterior acoustic shadowing associated this echogenic
structure suggesting there may be stones within this area. Negative
for a sonographic Murphy's sign.

Common bile duct:  Common bile duct measures 0.3 cm.

Liver:  No focal lesion identified.  Within normal limits in
parenchymal echogenicity.

IVC:  Appears normal.

Pancreas:  No focal abnormality seen.

Spleen:  Measures 8.6 cm in length.

Right Kidney:  Right kidney has a normal appearance.  Right kidney
measures 11.1 cm in length without hydronephrosis.

Left Kidney:  Normal appearance of the left kidney measuring
cm.  Negative for hydronephrosis.

Abdominal aorta:  No aneurysm identified.
IMPRESSION: There is a focal echogenic structure within the gallbladder which
appears to be non-mobile.  This may represent a combination of a
sludge ball with stones.

## 2012-11-16 IMAGING — US US ABDOMEN COMPLETE
1 series · 14 of 25 positions shown · non-contrast
Comparison: Previous examinations, the most recent dated
08/08/2011.

CLINICAL DATA: Epigastric abdominal pain.  Previously diagnosed
cholelithiasis.

COMPLETE ABDOMINAL ULTRASOUND

[Series 1: us abdomen complete · 0.30mm/px · 14 of 87 slices shown]
[im 1/87]
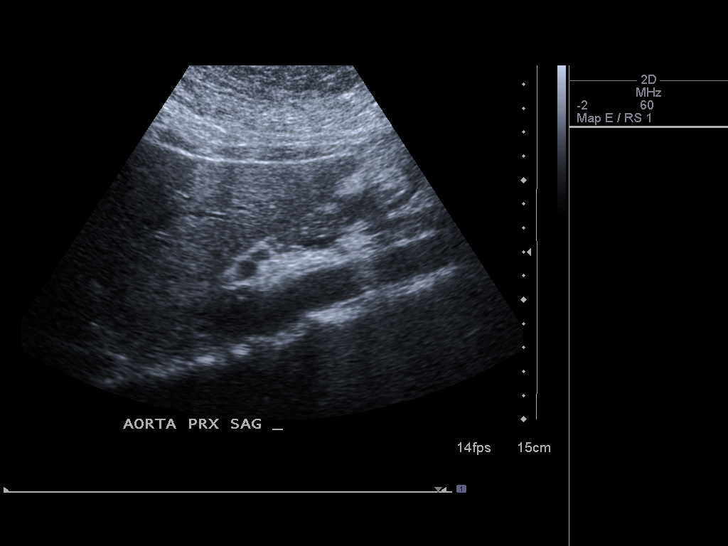
[im 8/87]
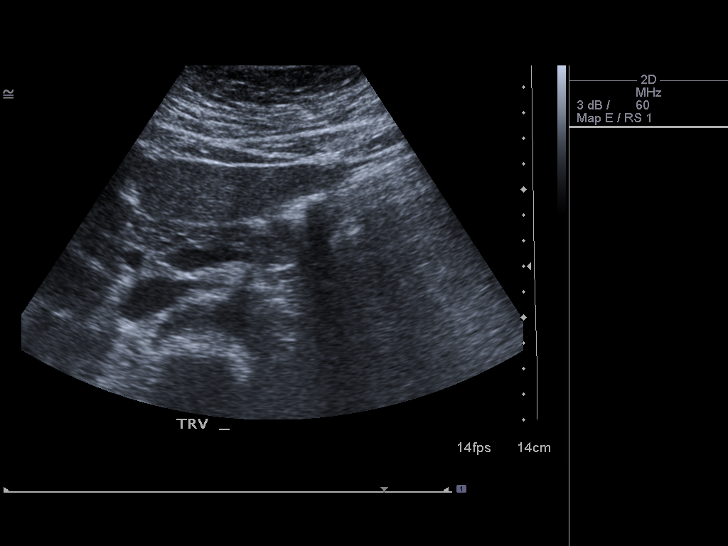
[im 15/87]
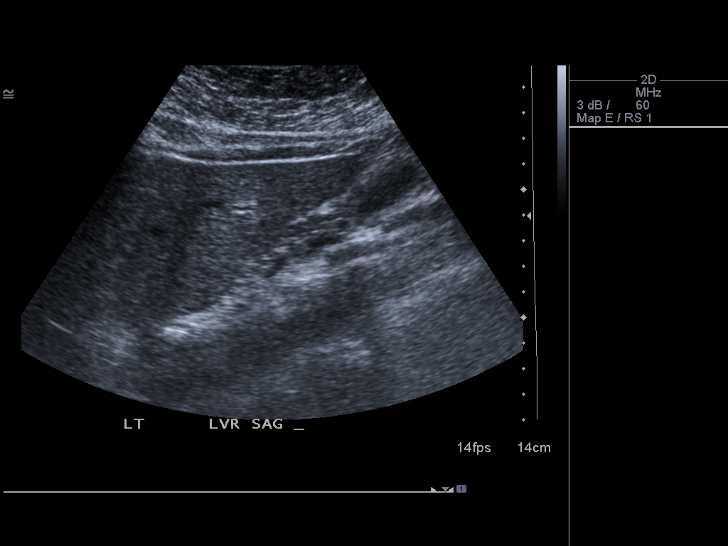
[im 22/87]
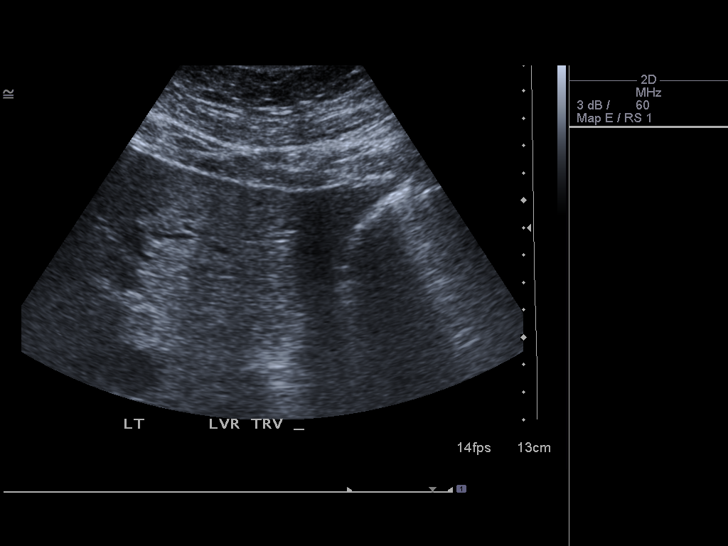
[im 29/87]
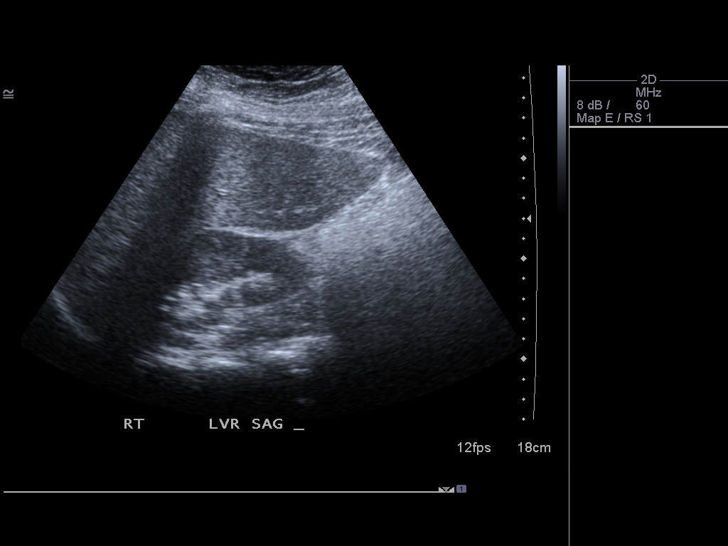
[im 33/87]
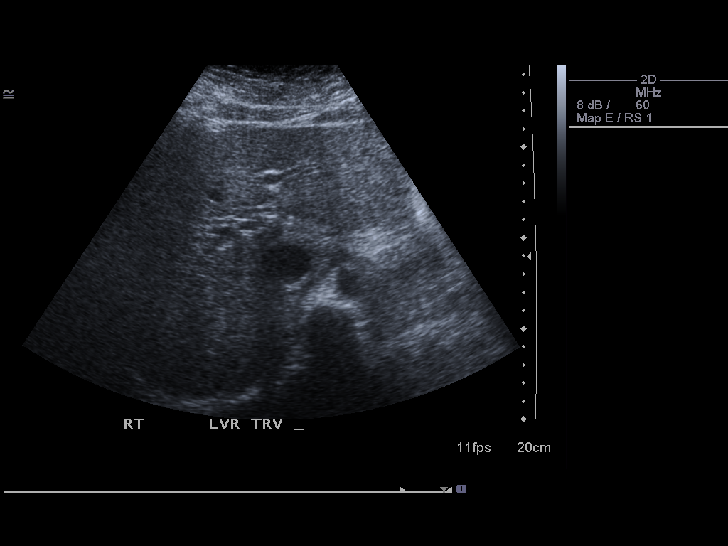
[im 40/87]
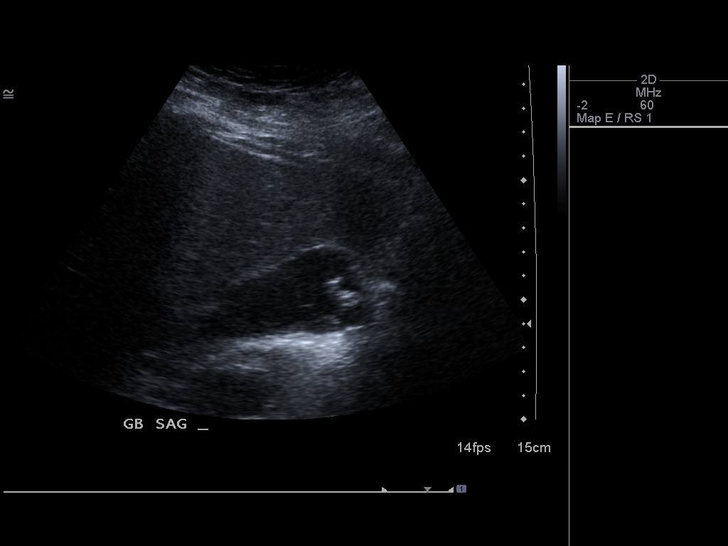
[im 47/87]
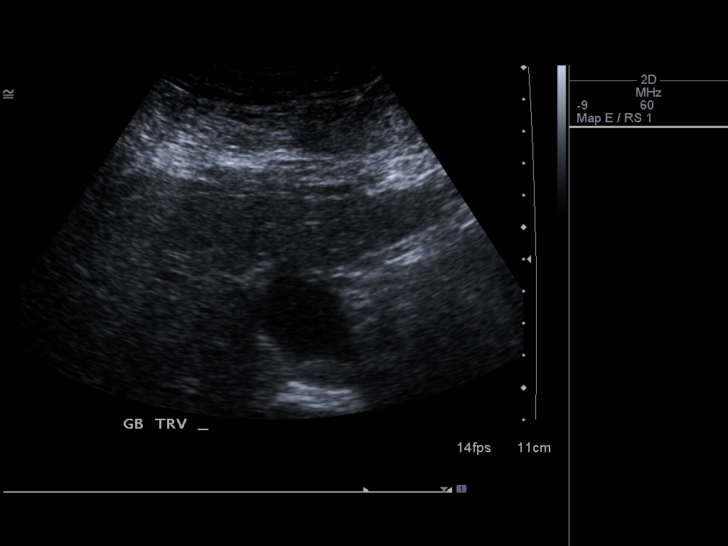
[im 54/87]
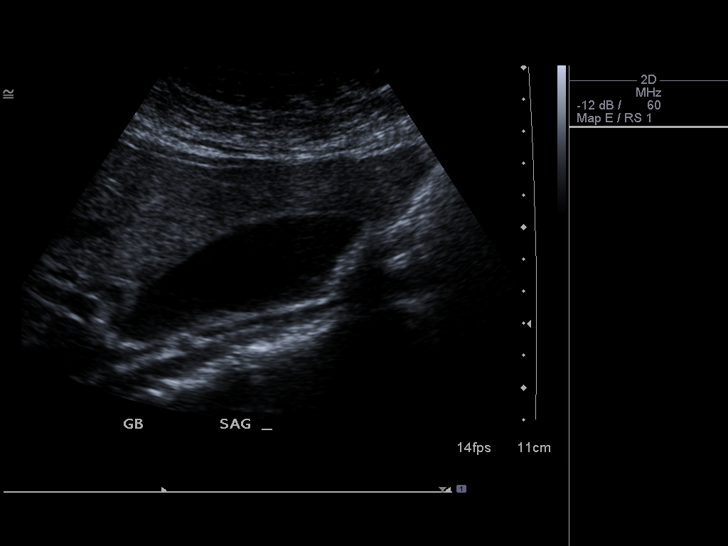
[im 58/87]
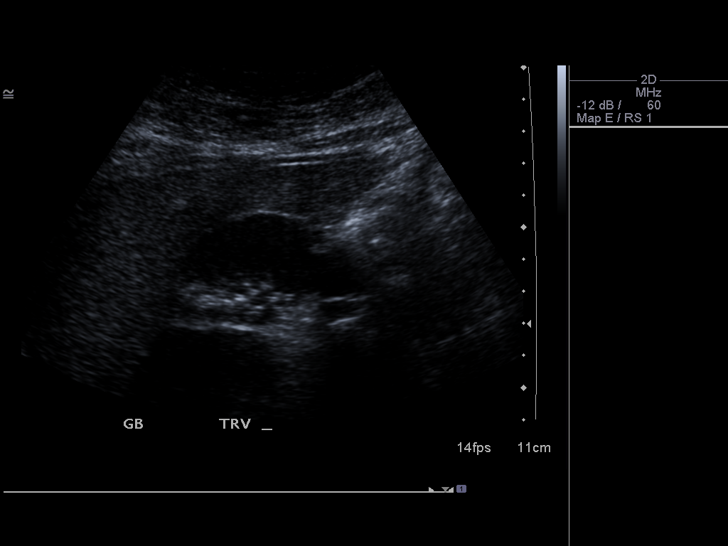
[im 65/87]
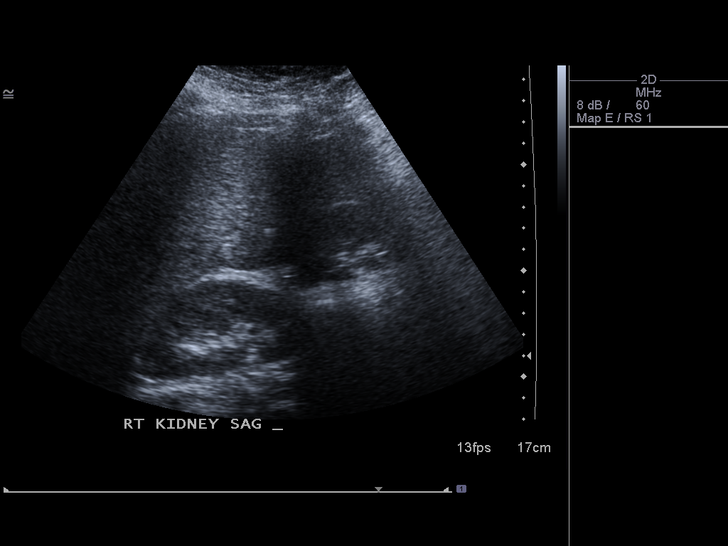
[im 72/87]
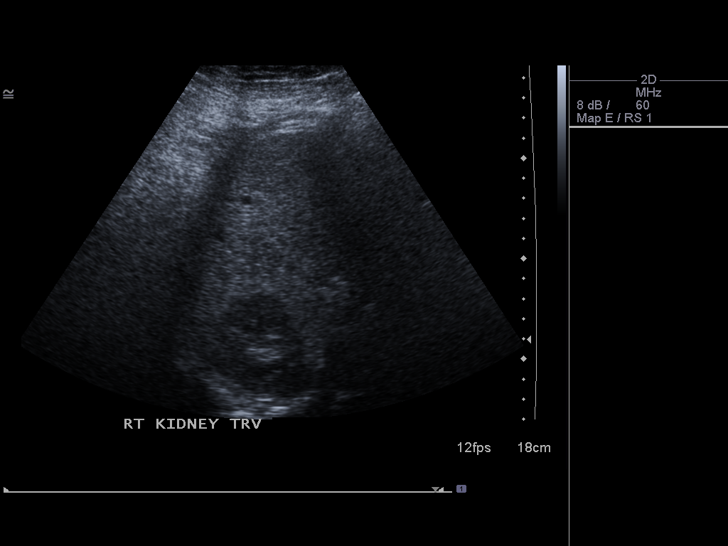
[im 79/87]
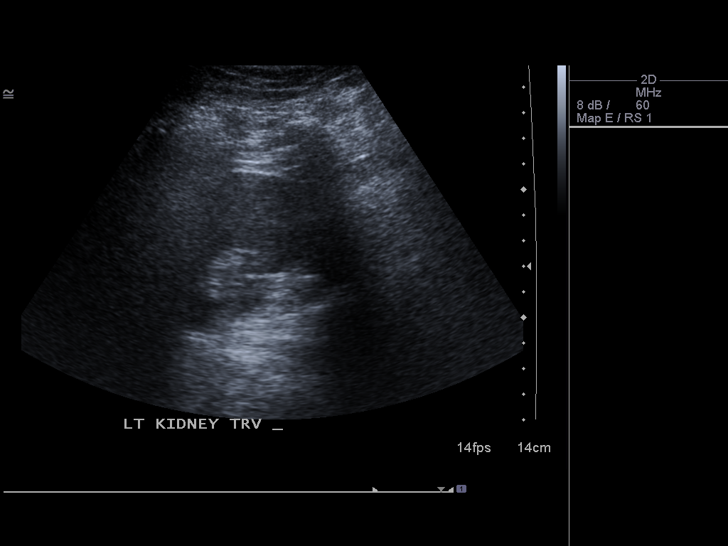
[im 87/87]
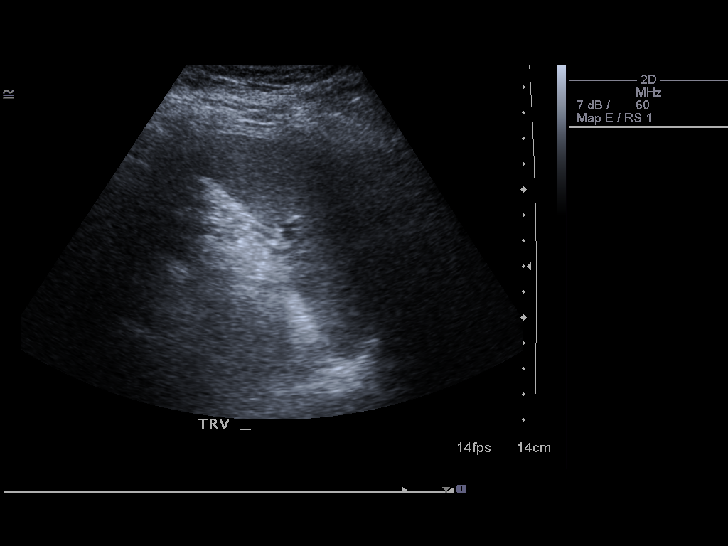

[14 of 25 positions shown; findings below may reference images not displayed]

FINDINGS: Gallbladder:  Multiple small gallstones are again demonstrated with
the largest individual stone measuring approximately 6 mm in
maximum diameter.  No gallbladder wall thickening or
pericholecystic fluid.  The patient was not focally tender over the
gallbladder.

Common bile duct:  Normal in caliber, measuring 2.1 mm in diameter
proximally.

Liver:  Mildly diffusely echogenic.

IVC:  Appears normal.

Pancreas:  No focal abnormality seen.

Spleen:  Normal, measuring 7.0 cm in length.

Right Kidney:  Normal, measuring 8.5 cm in length.

Left Kidney:  Normal, measuring 10.6 cm in length.

Abdominal aorta:  No aneurysm identified.
IMPRESSION: 1.  Stable cholelithiasis without evidence of cholecystitis.
2.  Stable mildly echogenic liver, suggesting hepatic steatosis.

## 2013-12-14 ENCOUNTER — Encounter (HOSPITAL_BASED_OUTPATIENT_CLINIC_OR_DEPARTMENT_OTHER): Payer: Self-pay | Admitting: Emergency Medicine

## 2015-02-13 NOTE — L&D Delivery Note (Signed)
Delivery Note Patient pushed well for 40 minutes.  At 6:39 PM a viable female was delivered via Vaginal, Spontaneous Delivery (Presentation: Occiput anterior  ).  APGAR: 8, 9; weight pending.   Placenta status: spontaneous  Cord: 3V  with the following complications: none .  Cord pH: n/a  Anesthesia:  Epidural Episiotomy: None Lacerations: None Suture Repair: n/a Est. Blood Loss (mL): 300  Mom to postpartum.  Baby to Couplet care / Skin to Skin.  [facility] Recovery HospitalDYANNA Levine Lemoyne Nestor 01/26/2016, 6:58 PM

## 2015-04-06 ENCOUNTER — Encounter (HOSPITAL_BASED_OUTPATIENT_CLINIC_OR_DEPARTMENT_OTHER): Payer: Self-pay | Admitting: Emergency Medicine

## 2015-04-06 ENCOUNTER — Emergency Department (HOSPITAL_BASED_OUTPATIENT_CLINIC_OR_DEPARTMENT_OTHER)
Admission: EM | Admit: 2015-04-06 | Discharge: 2015-04-06 | Disposition: A | Discharge status: Home / Self Care | Payer: Medicaid Other | Attending: Emergency Medicine | Admitting: Emergency Medicine

## 2015-04-06 DIAGNOSIS — Z87440 Personal history of urinary (tract) infections: Secondary | ICD-10-CM | POA: Diagnosis not present

## 2015-04-06 DIAGNOSIS — Z79899 Other long term (current) drug therapy: Secondary | ICD-10-CM | POA: Insufficient documentation

## 2015-04-06 DIAGNOSIS — I10 Essential (primary) hypertension: Secondary | ICD-10-CM | POA: Insufficient documentation

## 2015-04-06 DIAGNOSIS — Z87448 Personal history of other diseases of urinary system: Secondary | ICD-10-CM | POA: Insufficient documentation

## 2015-04-06 DIAGNOSIS — J029 Acute pharyngitis, unspecified: Secondary | ICD-10-CM | POA: Diagnosis present

## 2015-04-06 DIAGNOSIS — J020 Streptococcal pharyngitis: Secondary | ICD-10-CM | POA: Insufficient documentation

## 2015-04-06 DIAGNOSIS — Z8619 Personal history of other infectious and parasitic diseases: Secondary | ICD-10-CM | POA: Insufficient documentation

## 2015-04-06 DIAGNOSIS — E860 Dehydration: Secondary | ICD-10-CM | POA: Diagnosis not present

## 2015-04-06 DIAGNOSIS — K219 Gastro-esophageal reflux disease without esophagitis: Secondary | ICD-10-CM | POA: Insufficient documentation

## 2015-04-06 DIAGNOSIS — R000 Tachycardia, unspecified: Secondary | ICD-10-CM | POA: Insufficient documentation

## 2015-04-06 LAB — RAPID STREP SCREEN (MED CTR MEBANE ONLY): Streptococcus, Group A Screen (Direct): POSITIVE — AB

## 2015-04-06 MED ORDER — HYDROCODONE-ACETAMINOPHEN 5-325 MG PO TABS: 1.0000 | ORAL_TABLET | Freq: Four times a day (QID) | ORAL | Status: DC | PRN

## 2015-04-06 MED ORDER — ACETAMINOPHEN 500 MG PO TABS
1000.0000 mg | ORAL_TABLET | Freq: Once | ORAL | Status: AC
  Administered 2015-04-06: 1000 mg via ORAL
  Filled 2015-04-06: qty 2

## 2015-04-06 MED ORDER — PENICILLIN G BENZATHINE 1200000 UNIT/2ML IM SUSP
1.2000 10*6.[IU] | Freq: Once | INTRAMUSCULAR | Status: AC
  Administered 2015-04-06: 1.2 10*6.[IU] via INTRAMUSCULAR
  Filled 2015-04-06: qty 2

## 2015-04-06 NOTE — ED Provider Notes (Signed)
CSN: [id number]     Arrival date & time 04/06/15  1950 History   First MD Initiated Contact with Patient 04/06/15 2035     Chief Complaint  Patient presents with  . Sore Throat     (Consider location/radiation/quality/duration/timing/severity/associated sxs/prior Treatment) HPI Comments: Patient presents to the emergency department with chief complaint of sore throat and fever. She also reports feeling nauseated. She has had the symptoms for the past 2-3 days. She has tried taking ibuprofen with no relief. There are no modifying factors. She denies any other symptoms.  The history is provided by the patient. No language interpreter was used.    Past Medical History  Diagnosis Date  . H/O varicella   . H/O cystitis   . Yeast infection   . GERD (gastroesophageal reflux disease)   . Hypertension     no meds  . Postpartum anemia 07/21/2011    Hgb=8.4 on 6/8 (11.1) on admission  . UTI (urinary tract infection) following delivery 07/21/2011    Cath u/a with positive nitrites 07/21/11; per c/w Dr. Estanislado Pandy, will treat w/ Macrobid po bid x7 day.  Culture sent.  . Elevated AST (SGOT)     07/21/11=39; previously normal on admission and 07/10/11   Past Surgical History  Procedure Laterality Date  . No past surgeries     Family History  Problem Relation Age of Onset  . Hypertension Mother   . Stroke Maternal Uncle   . Diabetes Maternal Grandmother   . Hypertension Maternal Grandfather   . Mental illness Paternal Grandfather   . Asthma Cousin   . Anesthesia problems Neg Hx   . Hypotension Neg Hx   . Malignant hyperthermia Neg Hx   . Pseudochol deficiency Neg Hx    Social History  Substance Use Topics  . Smoking status: Never Smoker   . Smokeless tobacco: Never Used  . Alcohol Use: No   OB History    Gravida Para Term Preterm AB TAB SAB Ectopic Multiple Living   Review of Systems  Constitutional: Positive for fever and chills.  HENT: Positive for sore throat.  Negative for drooling and postnasal drip.   Respiratory: Negative for cough and shortness of breath.   Gastrointestinal: Negative for nausea, vomiting, diarrhea and constipation.      Allergies  Pollen extract  Home Medications   Prior to Admission medications   Medication Sig Start Date End Date Taking? Authorizing Provider  dicyclomine (BENTYL) 20 MG tablet 1 tablet every 6 hours as needed for gallbladder spasm. 08/18/11   John Molpus, MD  Fe Cbn-Fe Gluc-FA-B12-C-DSS (FERRALET 90) 90-1 MG TABS Take 1 capsule by mouth daily. 07/21/11   Candice Steelman, CNM  hydrochlorothiazide (HYDRODIURIL) 25 MG tablet Take 1 tablet (25 mg total) by mouth daily. 07/21/11 07/20/12  Candice Steelman, CNM  HYDROcodone-acetaminophen (NORCO) 5-325 MG per tablet Take 1-2 tablets by mouth every 6 hours as needed for pain. 08/08/11   Cyndra Numbers, MD  labetalol (NORMODYNE) 200 MG tablet Take 1 tablet (200 mg total) by mouth 2 (two) times daily. 07/21/11 07/20/12  Candice Steelman, CNM  pantoprazole (PROTONIX) 40 MG tablet Take 1 tablet (40 mg total) by mouth daily at 12 noon. 07/13/11 07/12/12  Nigel Bridgeman, CNM   BP 147/93 mmHg  Pulse 120  Temp(Src) 99.8 F (37.7 C) (Oral)  Resp 18  Ht  (1.626 m)  Wt 113.399 kg  BMI 42.89 kg/m2  SpO2 98%  LMP 03/14/2015 Physical Exam  Constitutional: She is oriented to person, place, and time. She appears well-developed and well-nourished.  HENT:  Head: Normocephalic and atraumatic.  Oropharynx is erythematous, positive exudates, moderate edema, no abscess  Eyes: Conjunctivae and EOM are normal. Pupils are equal, round, and reactive to light.  Neck: Normal range of motion. Neck supple.  Cardiovascular: Regular rhythm.  Exam reveals no gallop and no friction rub.   No murmur heard. Mild tachycardia on my exam ~104  Pulmonary/Chest: Effort normal and breath sounds normal. No respiratory distress. She has no wheezes. She has no rales. She exhibits no tenderness.  Abdominal:  Soft. Bowel sounds are normal. She exhibits no distension and no mass. There is no tenderness. There is no rebound and no guarding.  Musculoskeletal: Normal range of motion. She exhibits no edema or tenderness.  Neurological: She is alert and oriented to person, place, and time.  Skin: Skin is warm and dry.  Psychiatric: She has a normal mood and affect. Her behavior is normal. Judgment and thought content normal.  Nursing note and vitals reviewed.   ED Course  Procedures (including critical care time) Labs Review Labs Reviewed  RAPID STREP SCREEN (NOT AT [facility]) - Abnormal; Notable for the following:    Streptococcus, Group A Screen (Direct) POSITIVE (*)    All other components within normal limits     MDM   Final diagnoses:  Strep throat    Pt febrile with tonsillar exudate, cervical lymphadenopathy, & dysphagia; diagnosis of strep. Treated in the ED with tylenol and PCN IM.  Pt appears mildly dehydrated, discussed importance of water rehydration. Presentation non concerning for PTA or infxn spread to soft tissue. No trismus or uvula deviation. Specific return precautions discussed. Pt able to drink water in ED without difficulty with intact air way. Recommended PCP follow up.      Roxy Horseman, PA-C 04/06/15 1610  Vanetta Mulders, MD 04/07/15 [phone]

## 2015-04-06 NOTE — Discharge Instructions (Signed)

## 2015-04-06 NOTE — ED Notes (Signed)
Pt in c/o sore throat onset 2 days ago.

## 2015-05-31 ENCOUNTER — Encounter (HOSPITAL_BASED_OUTPATIENT_CLINIC_OR_DEPARTMENT_OTHER): Payer: Self-pay | Admitting: Emergency Medicine

## 2015-05-31 ENCOUNTER — Emergency Department (HOSPITAL_BASED_OUTPATIENT_CLINIC_OR_DEPARTMENT_OTHER)
Admission: EM | Admit: 2015-05-31 | Discharge: 2015-05-31 | Disposition: A | Discharge status: Home / Self Care | Payer: Medicaid Other | Attending: Emergency Medicine | Admitting: Emergency Medicine

## 2015-05-31 DIAGNOSIS — Z79899 Other long term (current) drug therapy: Secondary | ICD-10-CM | POA: Insufficient documentation

## 2015-05-31 DIAGNOSIS — S61011A Laceration without foreign body of right thumb without damage to nail, initial encounter: Secondary | ICD-10-CM | POA: Diagnosis not present

## 2015-05-31 DIAGNOSIS — Z8619 Personal history of other infectious and parasitic diseases: Secondary | ICD-10-CM | POA: Insufficient documentation

## 2015-05-31 DIAGNOSIS — Y9289 Other specified places as the place of occurrence of the external cause: Secondary | ICD-10-CM | POA: Diagnosis not present

## 2015-05-31 DIAGNOSIS — I10 Essential (primary) hypertension: Secondary | ICD-10-CM | POA: Insufficient documentation

## 2015-05-31 DIAGNOSIS — K219 Gastro-esophageal reflux disease without esophagitis: Secondary | ICD-10-CM | POA: Insufficient documentation

## 2015-05-31 DIAGNOSIS — Y998 Other external cause status: Secondary | ICD-10-CM | POA: Diagnosis not present

## 2015-05-31 DIAGNOSIS — Z87440 Personal history of urinary (tract) infections: Secondary | ICD-10-CM | POA: Diagnosis not present

## 2015-05-31 DIAGNOSIS — W290XXA Contact with powered kitchen appliance, initial encounter: Secondary | ICD-10-CM | POA: Insufficient documentation

## 2015-05-31 DIAGNOSIS — Y9389 Activity, other specified: Secondary | ICD-10-CM | POA: Insufficient documentation

## 2015-05-31 DIAGNOSIS — S6991XA Unspecified injury of right wrist, hand and finger(s), initial encounter: Secondary | ICD-10-CM | POA: Diagnosis present

## 2015-05-31 DIAGNOSIS — Z23 Encounter for immunization: Secondary | ICD-10-CM | POA: Diagnosis not present

## 2015-05-31 MED ORDER — TETANUS-DIPHTH-ACELL PERTUSSIS 5-2.5-18.5 LF-MCG/0.5 IM SUSP
0.5000 mL | Freq: Once | INTRAMUSCULAR | Status: AC
  Administered 2015-05-31: 0.5 mL via INTRAMUSCULAR
  Filled 2015-05-31: qty 0.5

## 2015-05-31 MED ORDER — BACITRACIN ZINC 500 UNIT/GM EX OINT
TOPICAL_OINTMENT | Freq: Two times a day (BID) | CUTANEOUS | Status: DC
  Administered 2015-05-31: 14:00:00 via TOPICAL
  Filled 2015-05-31: qty 28.35

## 2015-05-31 NOTE — Discharge Instructions (Signed)
Local wound care with bacitracin and dressing changes twice daily.  Return to the emergency department if you develop increasing pain, pus straining from the wound, or red streaking into the hand.   Laceration Care, Adult A laceration is a cut that goes through all of the layers of the skin and into the tissue that is right under the skin. Some lacerations heal on their own. Others need to be closed with stitches (sutures), staples, skin adhesive strips, or skin glue. Proper laceration care minimizes the risk of infection and helps the laceration to heal better. HOW TO CARE FOR YOUR LACERATION If sutures or staples were used:  Keep the wound clean and dry.  If you were given a bandage (dressing), you should change it at least one time per day or as told by your health care provider. You should also change it if it becomes wet or dirty.  Keep the wound completely dry for the first 24 hours or as told by your health care provider. After that time, you may shower or bathe. However, make sure that the wound is not soaked in water until after the sutures or staples have been removed.  Clean the wound one time each day or as told by your health care provider:  Wash the wound with soap and water.  Rinse the wound with water to remove all soap.  Pat the wound dry with a clean towel. Do not rub the wound.  After cleaning the wound, apply a thin layer of antibiotic ointmentas told by your health care provider. This will help to prevent infection and keep the dressing from sticking to the wound.  Have the sutures or staples removed as told by your health care provider. If skin adhesive strips were used:  Keep the wound clean and dry.  If you were given a bandage (dressing), you should change it at least one time per day or as told by your health care provider. You should also change it if it becomes dirty or wet.  Do not get the skin adhesive strips wet. You may shower or bathe, but be careful  to keep the wound dry.  If the wound gets wet, pat it dry with a clean towel. Do not rub the wound.  Skin adhesive strips fall off on their own. You may trim the strips as the wound heals. Do not remove skin adhesive strips that are still stuck to the wound. They will fall off in time. If skin glue was used:  Try to keep the wound dry, but you may briefly wet it in the shower or bath. Do not soak the wound in water, such as by swimming.  After you have showered or bathed, gently pat the wound dry with a clean towel. Do not rub the wound.  Do not do any activities that will make you sweat heavily until the skin glue has fallen off on its own.  Do not apply liquid, cream, or ointment medicine to the wound while the skin glue is in place. Using those may loosen the film before the wound has healed.  If you were given a bandage (dressing), you should change it at least one time per day or as told by your health care provider. You should also change it if it becomes dirty or wet.  If a dressing is placed over the wound, be careful not to apply tape directly over the skin glue. Doing that may cause the glue to be pulled off before the  wound has healed.  Do not pick at the glue. The skin glue usually remains in place for 5-10 days, then it falls off of the skin. General Instructions  Take over-the-counter and prescription medicines only as told by your health care provider.  If you were prescribed an antibiotic medicine or ointment, take or apply it as told by your doctor. Do not stop using it even if your condition improves.  To help prevent scarring, make sure to cover your wound with sunscreen whenever you are outside after stitches are removed, after adhesive strips are removed, or when glue remains in place and the wound is healed. Make sure to wear a sunscreen of at least 30 SPF.  Do not scratch or pick at the wound.  Keep all follow-up visits as told by your health care provider. This  is important.  Check your wound every day for signs of infection. Watch for:  Redness, swelling, or pain.  Fluid, blood, or pus.  Raise (elevate) the injured area above the level of your heart while you are sitting or lying down, if possible. SEEK MEDICAL CARE IF:  You received a tetanus shot and you have swelling, severe pain, redness, or bleeding at the injection site.  You have a fever.  A wound that was closed breaks open.  You notice a bad smell coming from your wound or your dressing.  You notice something coming out of the wound, such as wood or glass.  Your pain is not controlled with medicine.  You have increased redness, swelling, or pain at the site of your wound.  You have fluid, blood, or pus coming from your wound.  You notice a change in the color of your skin near your wound.  You need to change the dressing frequently due to fluid, blood, or pus draining from the wound.  You develop a new rash.  You develop numbness around the wound. SEEK IMMEDIATE MEDICAL CARE IF:  You develop severe swelling around the wound.  Your pain suddenly increases and is severe.  You develop painful lumps near the wound or on skin that is anywhere on your body.  You have a red streak going away from your wound.  The wound is on your hand or foot and you cannot properly move a finger or toe.  The wound is on your hand or foot and you notice that your fingers or toes look pale or bluish.   This information is not intended to replace advice given to you by your health care provider. Make sure you discuss any questions you have with your health care provider.   Document Released: 01/29/2005 Document Revised: 06/15/2014 Document Reviewed: 01/25/2014 Elsevier Interactive Patient Education [employer]2016 [employer]

## 2015-05-31 NOTE — ED Provider Notes (Signed)
CSN: [id number]     Arrival date & time 05/31/15  1403 History   First MD Initiated Contact with Patient 05/31/15 1411     Chief Complaint  Patient presents with  . Finger Injury     (Consider location/radiation/quality/duration/timing/severity/associated sxs/prior Treatment) HPI Comments: Patient is 30 year old female who presents with complaints of right thumb laceration. She states that she was cleaning her blender when she cut her thumb on one of the blades. Her last tetanus shot is unknown. Bleeding is controlled with direct pressure.  The history is provided by the patient.    Past Medical History  Diagnosis Date  . H/O varicella   . H/O cystitis   . Yeast infection   . GERD (gastroesophageal reflux disease)   . Hypertension     no meds  . Postpartum anemia 07/21/2011    Hgb=8.4 on 6/8 (11.1) on admission  . UTI (urinary tract infection) following delivery 07/21/2011    Cath u/a with positive nitrites 07/21/11; per c/w Dr. Estanislado Pandy, will treat w/ Macrobid po bid x7 day.  Culture sent.  . Elevated AST (SGOT)     07/21/11=39; previously normal on admission and 07/10/11   Past Surgical History  Procedure Laterality Date  . No past surgeries     Family History  Problem Relation Age of Onset  . Hypertension Mother   . Stroke Maternal Uncle   . Diabetes Maternal Grandmother   . Hypertension Maternal Grandfather   . Mental illness Paternal Grandfather   . Asthma Cousin   . Anesthesia problems Neg Hx   . Hypotension Neg Hx   . Malignant hyperthermia Neg Hx   . Pseudochol deficiency Neg Hx    Social History  Substance Use Topics  . Smoking status: Never Smoker   . Smokeless tobacco: Never Used  . Alcohol Use: No   OB History    Gravida Para Term Preterm AB TAB SAB Ectopic Multiple Living   Review of Systems  All other systems reviewed and are negative.     Allergies  Pollen extract  Home Medications   Prior to Admission medications    Medication Sig Start Date End Date Taking? Authorizing Provider  dicyclomine (BENTYL) 20 MG tablet 1 tablet every 6 hours as needed for gallbladder spasm. 08/18/11   John Molpus, MD  Fe Cbn-Fe Gluc-FA-B12-C-DSS (FERRALET 90) 90-1 MG TABS Take 1 capsule by mouth daily. 07/21/11   Candice Steelman, CNM  hydrochlorothiazide (HYDRODIURIL) 25 MG tablet Take 1 tablet (25 mg total) by mouth daily. 07/21/11 07/20/12  Candice Steelman, CNM  HYDROcodone-acetaminophen (NORCO/VICODIN) 5-325 MG tablet Take 1-2 tablets by mouth every 6 (six) hours as needed. 04/06/15   Roxy Horseman, PA-C  labetalol (NORMODYNE) 200 MG tablet Take 1 tablet (200 mg total) by mouth 2 (two) times daily. 07/21/11 07/20/12  Candice Steelman, CNM  pantoprazole (PROTONIX) 40 MG tablet Take 1 tablet (40 mg total) by mouth daily at 12 noon. 07/13/11 07/12/12  Nigel Bridgeman, CNM   BP 156/95 mmHg  Pulse 96  Temp(Src) 98.5 F (36.9 C) (Oral)  Resp 18  Ht  (1.626 m)  Wt 230 lb (104.327 kg)  BMI 39.46 kg/m2  SpO2 100%  LMP 04/30/2015 Physical Exam  Constitutional: She is oriented to person, place, and time. She appears well-developed and well-nourished. No distress.  HENT:  Head: Normocephalic and atraumatic.  Neck: Normal range of motion. Neck supple.  Neurological: She  is alert and oriented to person, place, and time.  Skin: Skin is warm and dry. She is not diaphoretic.  The right thumb is noted have a 1 cm, superficial laceration to the lateral aspect of the finger pad. Bleeding is controlled and laceration is well approximated.  Nursing note and vitals reviewed.   ED Course  Procedures (including critical care time) Labs Review Labs Reviewed - No data to display  Imaging Review No results found. I have personally reviewed and evaluated these images and lab results as part of my medical decision-making.   EKG Interpretation None      MDM   Final diagnoses:  None    No sutures indicated. Will treat with bacitracin  dressing, splint, and when necessary follow-up. Tetanus will be updated.    Geoffery Lyonsouglas Keiandre Cygan, MD 05/31/15 1420

## 2015-05-31 NOTE — ED Notes (Signed)
Patient states that she was cleaning her blender and she cut her right thumb

## 2015-06-04 ENCOUNTER — Emergency Department (HOSPITAL_BASED_OUTPATIENT_CLINIC_OR_DEPARTMENT_OTHER)
Admission: EM | Admit: 2015-06-04 | Discharge: 2015-06-04 | Disposition: A | Discharge status: Home / Self Care | Payer: Medicaid Other | Attending: Emergency Medicine | Admitting: Emergency Medicine

## 2015-06-04 ENCOUNTER — Encounter (HOSPITAL_BASED_OUTPATIENT_CLINIC_OR_DEPARTMENT_OTHER): Payer: Self-pay | Admitting: Emergency Medicine

## 2015-06-04 DIAGNOSIS — R5383 Other fatigue: Secondary | ICD-10-CM | POA: Diagnosis present

## 2015-06-04 DIAGNOSIS — I10 Essential (primary) hypertension: Secondary | ICD-10-CM | POA: Diagnosis not present

## 2015-06-04 DIAGNOSIS — Z79899 Other long term (current) drug therapy: Secondary | ICD-10-CM | POA: Diagnosis not present

## 2015-06-04 DIAGNOSIS — Z8619 Personal history of other infectious and parasitic diseases: Secondary | ICD-10-CM | POA: Insufficient documentation

## 2015-06-04 DIAGNOSIS — Z331 Pregnant state, incidental: Secondary | ICD-10-CM | POA: Insufficient documentation

## 2015-06-04 DIAGNOSIS — R110 Nausea: Secondary | ICD-10-CM | POA: Insufficient documentation

## 2015-06-04 DIAGNOSIS — Z862 Personal history of diseases of the blood and blood-forming organs and certain disorders involving the immune mechanism: Secondary | ICD-10-CM | POA: Diagnosis not present

## 2015-06-04 DIAGNOSIS — Z87440 Personal history of urinary (tract) infections: Secondary | ICD-10-CM | POA: Insufficient documentation

## 2015-06-04 DIAGNOSIS — K219 Gastro-esophageal reflux disease without esophagitis: Secondary | ICD-10-CM | POA: Insufficient documentation

## 2015-06-04 DIAGNOSIS — Z3490 Encounter for supervision of normal pregnancy, unspecified, unspecified trimester: Secondary | ICD-10-CM

## 2015-06-04 LAB — URINALYSIS, ROUTINE W REFLEX MICROSCOPIC
Bilirubin Urine: NEGATIVE
Glucose, UA: NEGATIVE mg/dL
Hgb urine dipstick: NEGATIVE
Ketones, ur: NEGATIVE mg/dL
NITRITE: NEGATIVE
Protein, ur: NEGATIVE mg/dL
SPECIFIC GRAVITY, URINE: 1.023 (ref 1.005–1.030)
pH: 6 (ref 5.0–8.0)

## 2015-06-04 LAB — CBC WITH DIFFERENTIAL/PLATELET
BASOS ABS: 0 10*3/uL (ref 0.0–0.1)
Basophils Relative: 0 %
EOS PCT: 1 %
Eosinophils Absolute: 0.1 10*3/uL (ref 0.0–0.7)
HEMATOCRIT: 35.6 % — AB (ref 36.0–46.0)
HEMOGLOBIN: 12.3 g/dL (ref 12.0–15.0)
LYMPHS PCT: 35 %
Lymphs Abs: 2.5 10*3/uL (ref 0.7–4.0)
MCH: 29.4 pg (ref 26.0–34.0)
MCHC: 34.6 g/dL (ref 30.0–36.0)
MCV: 85.2 fL (ref 78.0–100.0)
MONO ABS: 0.5 10*3/uL (ref 0.1–1.0)
MONOS PCT: 7 %
Neutro Abs: 4.1 10*3/uL (ref 1.7–7.7)
Neutrophils Relative %: 57 %
Platelets: 373 10*3/uL (ref 150–400)
RBC: 4.18 MIL/uL (ref 3.87–5.11)
RDW: 14.1 % (ref 11.5–15.5)
WBC: 7.2 10*3/uL (ref 4.0–10.5)

## 2015-06-04 LAB — BASIC METABOLIC PANEL
Anion gap: 9 (ref 5–15)
BUN: 9 mg/dL (ref 6–20)
CALCIUM: 9.3 mg/dL (ref 8.9–10.3)
CO2: 22 mmol/L (ref 22–32)
Chloride: 105 mmol/L (ref 101–111)
Creatinine, Ser: 0.84 mg/dL (ref 0.44–1.00)
GFR calc Af Amer: >60 mL/min (ref >60)
GLUCOSE: 81 mg/dL (ref 65–99)
Potassium: 3.5 mmol/L (ref 3.5–5.1)
Sodium: 136 mmol/L (ref 135–145)

## 2015-06-04 LAB — URINE MICROSCOPIC-ADD ON: RBC / HPF: NONE SEEN RBC/hpf (ref 0–5)

## 2015-06-04 LAB — PREGNANCY, URINE: Preg Test, Ur: POSITIVE — AB

## 2015-06-04 MED ORDER — ONDANSETRON HCL 4 MG PO TABS: 4.0000 mg | ORAL_TABLET | Freq: Three times a day (TID) | ORAL | Status: DC | PRN

## 2015-06-04 NOTE — ED Notes (Signed)
Pt reports that her cycle is five days late and there is a possibility she is pregnant

## 2015-06-04 NOTE — ED Notes (Signed)
Pt reports fatigue, nausea x 3 days, no vomiting or diarrhea no,

## 2015-06-04 NOTE — ED Notes (Signed)
Pt states has felt fatigued and having nausea for the past three days.

## 2015-06-04 NOTE — Discharge Instructions (Signed)
First Trimester of Pregnancy The first trimester of pregnancy is from week 1 until the end of week 12 (months 1 through 3). A week after a sperm fertilizes an egg, the egg will implant on the wall of the uterus. This embryo will begin to develop into a baby. Genes from you and your partner are forming the baby. The female genes determine whether the baby is a boy or a girl. At 6-8 weeks, the eyes and face are formed, and the heartbeat can be seen on ultrasound. At the end of 12 weeks, all the baby's organs are formed.  Now that you are pregnant, you will want to do everything you can to have a healthy baby. Two of the most important things are to get good prenatal care and to follow your health care provider's instructions. Prenatal care is all the medical care you receive before the baby's birth. This care will help prevent, find, and treat any problems during the pregnancy and childbirth. BODY CHANGES Your body goes through many changes during pregnancy. The changes vary from woman to woman.   You may gain or lose a couple of pounds at first.  You may feel sick to your stomach (nauseous) and throw up (vomit). If the vomiting is uncontrollable, call your health care provider.  You may tire easily.  You may develop headaches that can be relieved by medicines approved by your health care provider.  You may urinate more often. Painful urination may mean you have a bladder infection.  You may develop heartburn as a result of your pregnancy.  You may develop constipation because certain hormones are causing the muscles that push waste through your intestines to slow down.  You may develop hemorrhoids or swollen, bulging veins (varicose veins).  Your breasts may begin to grow larger and become tender. Your nipples may stick out more, and the tissue that surrounds them (areola) may become darker.  Your gums may bleed and may be sensitive to brushing and flossing.  Dark spots or blotches (chloasma,  mask of pregnancy) may develop on your face. This will likely fade after the baby is born.  Your menstrual periods will stop.  You may have a loss of appetite.  You may develop cravings for certain kinds of food.  You may have changes in your emotions from day to day, such as being excited to be pregnant or being concerned that something may go wrong with the pregnancy and baby.  You may have more vivid and strange dreams.  You may have changes in your hair. These can include thickening of your hair, rapid growth, and changes in texture. Some women also have hair loss during or after pregnancy, or hair that feels dry or thin. Your hair will most likely return to normal after your baby is born. WHAT TO EXPECT AT YOUR PRENATAL VISITS During a routine prenatal visit:  You will be weighed to make sure you and the baby are growing normally.  Your blood pressure will be taken.  Your abdomen will be measured to track your baby's growth.  The fetal heartbeat will be listened to starting around week 10 or 12 of your pregnancy.  Test results from any previous visits will be discussed. Your health care provider may ask you:  How you are feeling.  If you are feeling the baby move.  If you have had any abnormal symptoms, such as leaking fluid, bleeding, severe headaches, or abdominal cramping.  If you are using any tobacco products,   including cigarettes, chewing tobacco, and electronic cigarettes.  If you have any questions. Other tests that may be performed during your first trimester include:  Blood tests to find your blood type and to check for the presence of any previous infections. They will also be used to check for low iron levels (anemia) and Rh antibodies. Later in the pregnancy, blood tests for diabetes will be done along with other tests if problems develop.  Urine tests to check for infections, diabetes, or protein in the urine.  An ultrasound to confirm the proper growth  and development of the baby.  An amniocentesis to check for possible genetic problems.  Fetal screens for spina bifida and Down syndrome.  You may need other tests to make sure you and the baby are doing well.  HIV (human immunodeficiency virus) testing. Routine prenatal testing includes screening for HIV, unless you choose not to have this test. HOME CARE INSTRUCTIONS  Medicines  Follow your health care provider's instructions regarding medicine use. Specific medicines may be either safe or unsafe to take during pregnancy.  Take your prenatal vitamins as directed.  If you develop constipation, try taking a stool softener if your health care provider approves. Diet  Eat regular, well-balanced meals. Choose a variety of foods, such as meat or vegetable-based protein, fish, milk and low-fat dairy products, vegetables, fruits, and whole grain breads and cereals. Your health care provider will help you determine the amount of weight gain that is right for you.  Avoid raw meat and uncooked cheese. These carry germs that can cause birth defects in the baby.  Eating four or five small meals rather than three large meals a day may help relieve nausea and vomiting. If you start to feel nauseous, eating a few soda crackers can be helpful. Drinking liquids between meals instead of during meals also seems to help nausea and vomiting.  If you develop constipation, eat more high-fiber foods, such as fresh vegetables or fruit and whole grains. Drink enough fluids to keep your urine clear or pale yellow. Activity and Exercise  Exercise only as directed by your health care provider. Exercising will help you:  Control your weight.  Stay in shape.  Be prepared for labor and delivery.  Experiencing pain or cramping in the lower abdomen or low back is a good sign that you should stop exercising. Check with your health care provider before continuing normal exercises.  Try to avoid standing for long  periods of time. Move your legs often if you must stand in one place for a long time.  Avoid heavy lifting.  Wear low-heeled shoes, and practice good posture.  You may continue to have sex unless your health care provider directs you otherwise. Relief of Pain or Discomfort  Wear a good support bra for breast tenderness.   Take warm sitz baths to soothe any pain or discomfort caused by hemorrhoids. Use hemorrhoid cream if your health care provider approves.   Rest with your legs elevated if you have leg cramps or low back pain.  If you develop varicose veins in your legs, wear support hose. Elevate your feet for 15 minutes, 3-4 times a day. Limit salt in your diet. Prenatal Care  Schedule your prenatal visits by the twelfth week of pregnancy. They are usually scheduled monthly at first, then more often in the last 2 months before delivery.  Write down your questions. Take them to your prenatal visits.  Keep all your prenatal visits as directed by your   health care provider. Safety  Wear your seat belt at all times when driving.  Make a list of emergency phone numbers, including numbers for family, friends, the hospital, and police and fire departments. General Tips  Ask your health care provider for a referral to a local prenatal education class. Begin classes no later than at the beginning of month 6 of your pregnancy.  Ask for help if you have counseling or nutritional needs during pregnancy. Your health care provider can offer advice or refer you to specialists for help with various needs.  Do not use hot tubs, steam rooms, or saunas.  Do not douche or use tampons or scented sanitary pads.  Do not cross your legs for long periods of time.  Avoid cat litter boxes and soil used by cats. These carry germs that can cause birth defects in the baby and possibly loss of the fetus by miscarriage or stillbirth.  Avoid all smoking, herbs, alcohol, and medicines not prescribed by  your health care provider. Chemicals in these affect the formation and growth of the baby.  Do not use any tobacco products, including cigarettes, chewing tobacco, and electronic cigarettes. If you need help quitting, ask your health care provider. You may receive counseling support and other resources to help you quit.  Schedule a dentist appointment. At home, brush your teeth with a soft toothbrush and be gentle when you floss. SEEK MEDICAL CARE IF:   You have dizziness.  You have mild pelvic cramps, pelvic pressure, or nagging pain in the abdominal area.  You have persistent nausea, vomiting, or diarrhea.  You have a bad smelling vaginal discharge.  You have pain with urination.  You notice increased swelling in your face, hands, legs, or ankles. SEEK IMMEDIATE MEDICAL CARE IF:   You have a fever.  You are leaking fluid from your vagina.  You have spotting or bleeding from your vagina.  You have severe abdominal cramping or pain.  You have rapid weight gain or loss.  You vomit blood or material that looks like coffee grounds.  You are exposed to German measles and have never had them.  You are exposed to fifth disease or chickenpox.  You develop a severe headache.  You have shortness of breath.  You have any kind of trauma, such as from a fall or a car accident.   This information is not intended to replace advice given to you by your health care provider. Make sure you discuss any questions you have with your health care provider.   Document Released: 01/23/2001 Document Revised: 02/19/2014 Document Reviewed: 12/09/2012 Elsevier Interactive Patient Education 2016 [employer]  

## 2015-06-04 NOTE — ED Provider Notes (Signed)
CSN: [id number]     Arrival date & time 06/04/15  1630 History  By signing my name below, I, Stacey Levine, attest that this documentation has been prepared under the direction and in the presence of Stacey Nayobert Kyliana Standen, MD . Electronically Signed: Marisue HumbleMichelle Levine, Scribe. 06/04/2015. 5:33 PM.   Chief Complaint  Patient presents with  . Fatigue   The history is provided by the patient. No language interpreter was used.   HPI Comments:  Stacey Levine is a 30 y.o. female with PMhx of HTN and GERD who presents to the Emergency Department complaining of persistent fatigue and nausea for the past three days. Pt notes her current cycle is five days late. No alleviating factors noted or treatments attempted PTA. She denies feeling this way previously. Pt notes nausea with cholecystectomy in 2013. Denies vomiting.  Past Medical History  Diagnosis Date  . H/O varicella   . H/O cystitis   . Yeast infection   . GERD (gastroesophageal reflux disease)   . Hypertension     no meds  . Postpartum anemia 07/21/2011    Hgb=8.4 on 6/8 (11.1) on admission  . UTI (urinary tract infection) following delivery 07/21/2011    Cath u/a with positive nitrites 07/21/11; per c/w Dr. Estanislado Pandyivard, will treat w/ Macrobid po bid x7 day.  Culture sent.  . Elevated AST (SGOT)     07/21/11=39; previously normal on admission and 07/10/11   Past Surgical History  Procedure Laterality Date  . No past surgeries     Family History  Problem Relation Age of Onset  . Hypertension Mother   . Stroke Maternal Uncle   . Diabetes Maternal Grandmother   . Hypertension Maternal Grandfather   . Mental illness Paternal Grandfather   . Asthma Cousin   . Anesthesia problems Neg Hx   . Hypotension Neg Hx   . Malignant hyperthermia Neg Hx   . Pseudochol deficiency Neg Hx    Social History  Substance Use Topics  . Smoking status: Never Smoker   . Smokeless tobacco: Never Used  . Alcohol Use: No   OB History    Gravida Para Term Preterm AB  TAB SAB Ectopic Multiple Living   1 1 1       1      Review of Systems 10 systems reviewed and all are negative for acute change except as noted in the HPI.   Allergies  Pollen extract  Home Medications   Prior to Admission medications   Medication Sig Start Date End Date Taking? Authorizing Provider  dicyclomine (BENTYL) 20 MG tablet 1 tablet every 6 hours as needed for gallbladder spasm. 08/18/11   John Molpus, MD  Fe Cbn-Fe Gluc-FA-B12-C-DSS (FERRALET 90) 90-1 MG TABS Take 1 capsule by mouth daily. 07/21/11   Candice Steelman, CNM  hydrochlorothiazide (HYDRODIURIL) 25 MG tablet Take 1 tablet (25 mg total) by mouth daily. 07/21/11 07/20/12  Candice Steelman, CNM  HYDROcodone-acetaminophen (NORCO/VICODIN) 5-325 MG tablet Take 1-2 tablets by mouth every 6 (six) hours as needed. 04/06/15   Roxy Horsemanobert Browning, PA-C  labetalol (NORMODYNE) 200 MG tablet Take 1 tablet (200 mg total) by mouth 2 (two) times daily. 07/21/11 07/20/12  Candice Steelman, CNM  ondansetron (ZOFRAN) 4 MG tablet Take 1 tablet (4 mg total) by mouth every 8 (eight) hours as needed for nausea or vomiting. 06/04/15   Stacey Nayobert Barre Aydelott, MD  pantoprazole (PROTONIX) 40 MG tablet Take 1 tablet (40 mg total) by mouth daily at 12 noon. 07/13/11 07/12/12  Stacey Levine,  CNM   BP 152/80 mmHg  Pulse 93  Temp(Src) 99 F (37.2 C) (Oral)  Resp 20  Ht  (1.626 m)  Wt 210 lb (95.255 kg)  BMI 36.03 kg/m2  SpO2 100%  LMP 04/30/2015 Physical Exam Physical Exam  Nursing note and vitals reviewed. Constitutional: She is oriented to person, place, and time. She appears well-developed and well-nourished. No distress.  HENT:  Head: Normocephalic and atraumatic.  Eyes: Pupils are equal, round, and reactive to light.  Neck: Normal range of motion.  Cardiovascular: Normal rate and intact distal pulses.   Pulmonary/Chest: No respiratory distress.  Abdominal: Normal appearance. She exhibits no distension.  Musculoskeletal: Normal range of motion.   Neurological: She is alert and oriented to person, place, and time. No cranial nerve deficit.  Skin: Skin is warm and dry. No rash noted.  Psychiatric: She has a normal mood and affect. Her behavior is normal.   ED Course  Procedures  DIAGNOSTIC STUDIES:  Oxygen Saturation is 100% on RA, normal by my interpretation.    COORDINATION OF CARE:  5:31 PM Will discharge pending blood work results. Discussed treatment plan with pt at bedside and pt agreed to plan.  Labs Review Labs Reviewed  URINALYSIS, ROUTINE W REFLEX MICROSCOPIC (NOT AT [facility]) - Abnormal; Notable for the following:    Leukocytes, UA MODERATE (*)    All other components within normal limits  PREGNANCY, URINE - Abnormal; Notable for the following:    Preg Test, Ur POSITIVE (*)    All other components within normal limits  URINE MICROSCOPIC-ADD ON - Abnormal; Notable for the following:    Squamous Epithelial / LPF 0-5 (*)    Bacteria, UA MANY (*)    All other components within normal limits  CBC WITH DIFFERENTIAL/PLATELET - Abnormal; Notable for the following:    HCT 35.6 (*)    All other components within normal limits  BASIC METABOLIC PANEL      MDM   Final diagnoses:  Pregnancy    I personally performed the services described in this documentation, which was scribed in my presence. The recorded information has been reviewed and considered.    Stacey Nay, MD 06/04/15 [phone]

## 2015-06-04 NOTE — ED Notes (Signed)
Denies any N/V/D 

## 2015-07-29 LAB — OB RESULTS CONSOLE RUBELLA ANTIBODY, IGM: RUBELLA: IMMUNE

## 2015-07-29 LAB — OB RESULTS CONSOLE GC/CHLAMYDIA
CHLAMYDIA, DNA PROBE: NEGATIVE
GC PROBE AMP, GENITAL: NEGATIVE

## 2015-07-29 LAB — OB RESULTS CONSOLE HEPATITIS B SURFACE ANTIGEN: Hepatitis B Surface Ag: NEGATIVE

## 2015-07-29 LAB — OB RESULTS CONSOLE ANTIBODY SCREEN: ANTIBODY SCREEN: NEGATIVE

## 2015-07-29 LAB — OB RESULTS CONSOLE ABO/RH: RH Type: POSITIVE

## 2015-07-29 LAB — OB RESULTS CONSOLE HIV ANTIBODY (ROUTINE TESTING): HIV: NONREACTIVE

## 2015-07-29 LAB — OB RESULTS CONSOLE RPR: RPR: NONREACTIVE

## 2015-09-23 ENCOUNTER — Other Ambulatory Visit: Payer: Self-pay | Admitting: Obstetrics and Gynecology

## 2015-11-21 ENCOUNTER — Encounter: Payer: Self-pay | Admitting: Cardiovascular Disease

## 2015-11-21 ENCOUNTER — Encounter (INDEPENDENT_AMBULATORY_CARE_PROVIDER_SITE_OTHER): Payer: Self-pay

## 2015-11-21 ENCOUNTER — Ambulatory Visit (INDEPENDENT_AMBULATORY_CARE_PROVIDER_SITE_OTHER): Payer: Medicaid Other | Admitting: Cardiovascular Disease

## 2015-11-21 VITALS — BP 120/72 | HR 95 | Ht 65.0 in | Wt 246.0 lb

## 2015-11-21 DIAGNOSIS — O10919 Unspecified pre-existing hypertension complicating pregnancy, unspecified trimester: Secondary | ICD-10-CM | POA: Diagnosis not present

## 2015-11-21 DIAGNOSIS — R9431 Abnormal electrocardiogram [ECG] [EKG]: Secondary | ICD-10-CM

## 2015-11-21 NOTE — Progress Notes (Signed)
Cardiology Consultation Note    Date:  11/21/2015   ID:  Stacey Levine, DOB 06-03-1985, MRN [id number]  PCP:  Hal Morales, MD  Cardiologist:   Thurmon Fair, MD   Chief Complaint  Patient presents with  . New Evaluation    pt c/o blood pressure being high with pregnancy   . Hypertension    History of Present Illness:  Stacey Levine is a 30 y.o. female now roughly 7 months pregnant with her second child, presenting with episodic hypertension. Roughly once a week she has a headache and notes that her blood pressure is elevated. At other times her blood pressures completely normal, which is the case today. During her first pregnancy she did take antihypertensive medications during her last trimester (labetalol and hydrochlorothiazide). She denies chest pain, shortness of breath, palpitations, true focal neurological complaints. She does have leg edema if she stands for long periods of time. Compliance with sodium restriction has been sketchy. She was not aware of the very high sodium content of a luncheon meats, for example.  At her monthly OB visits since June her blood pressure has been 140/90, 144/48, 138/84, 122/68, and 38/78. This morning her blood pressure is 120/70. She does not have glucosuria and has not had preeclampsia. It appears that her pregnancy is otherwise uncomplicated.  The patient's mother has hypertension.  Past Medical History:  Diagnosis Date  . Elevated AST (SGOT)    07/21/11=39; previously normal on admission and 07/10/11  . GERD (gastroesophageal reflux disease)   . H/O cystitis   . H/O varicella   . Hypertension    no meds  . Postpartum anemia 07/21/2011   Hgb=8.4 on 6/8 (11.1) on admission  . UTI (urinary tract infection) following delivery 07/21/2011   Cath u/a with positive nitrites 07/21/11; per c/w Dr. Estanislado Pandy, will treat w/ Macrobid po bid x7 day.  Culture sent.  . Yeast infection     Past Surgical History:  Procedure Laterality Date  . NO PAST  SURGERIES      Current Medications: No outpatient prescriptions prior to visit.   No facility-administered medications prior to visit.      Allergies:   Pollen extract   Social History   Social History  . Marital status: Single    Spouse name: N/A  . Number of children: N/A  . Years of education: N/A   Social History Main Topics  . Smoking status: Never Smoker  . Smokeless tobacco: Never Used  . Alcohol use No  . Drug use: No  . Sexual activity: Yes    Birth control/ protection: None   Other Topics Concern  . None   Social History Narrative  . None     Family History:  The patient's family history includes Asthma in her cousin; Diabetes in her maternal grandmother; Hypertension in her maternal grandfather and mother; Mental illness in her paternal grandfather; Stroke in her maternal uncle.   ROS:   Please see the history of present illness.    ROS All other systems reviewed and are negative.   PHYSICAL EXAM:   VS:  BP 120/72 (BP Location: Right Arm, Patient Position: Sitting, Cuff Size: Normal)   Pulse 95   Ht 5\' 5"  (1.651 m)   Wt 246 lb (111.6 kg)   LMP 05/05/2015 (Exact Date)   SpO2 97%   BMI 40.94 kg/m    GEN: Well nourished, well developed, in no acute distress  HEENT: normal  Neck: no JVD, carotid bruits, or masses  Cardiac: RRR; no murmurs, rubs, or gallops,no edema  Respiratory:  clear to auscultation bilaterally, normal work of breathing GI: soft, nontender, nondistended, + BS MS: no deformity or atrophy  Skin: warm and dry, no rash Neuro:  Alert and Oriented x 3, Strength and sensation are intact Psych: euthymic mood, full affect  Wt Readings from Last 3 Encounters:  11/21/15 246 lb (111.6 kg)  06/04/15 210 lb (95.3 kg)  05/31/15 230 lb (104.3 kg)      Studies/Labs Reviewed:   EKG:  EKG is ordered today.  The ekg ordered today demonstrates Sinus rhythm, inverted T waves in leads 3, aVF and V6 with a pattern strongly suggestive of left  ventricular hypertrophy rinse his obesity probably masks the high-voltage).  Recent Labs: 06/04/2015: BUN 9; Creatinine, Ser 0.84; Hemoglobin 12.3; Platelets 373; Potassium 3.5; Sodium 136   Lipid Panel No results found for: CHOL, TRIG, HDL, CHOLHDL, VLDL, LDLCALC, LDLDIRECT  Additional studies/ records that were reviewed today include:  [facility]    ASSESSMENT:    1. Chronic hypertension in pregnancy   2. Abnormal ECG      PLAN:  In order of problems listed above:  1. HTN: She appears to have mild/borderline hypertension, but interestingly her EKG appears to show changes of left ventricular hypertrophy. I have recommended that she should undergo echocardiography to confirm this. She should focus on sodium restriction in her diet, avoiding excessive weight gain and walking on a daily basis. Reviewed daily recommended sodium intake and went over the list of high sodium content foods that she should avoid. I hope we can avoid the need to restart antihypertensive medications during this pregnancy. If she develops frequent headaches or persistently elevated blood pressure may have to restart labetalol. She should seek immediate medical attention if she develops hypertension associated with shortness of breath, chest pain, focal neurological complaints. 2. Suspect ECG changes are due to LVH, partially masked by obesity.    Medication Adjustments/Labs and Tests Ordered: Current medicines are reviewed at length with the patient today.  Concerns regarding medicines are outlined above.  Medication changes, Labs and Tests ordered today are listed in the Patient Instructions below. Patient Instructions  Medication Instructions: Dr Royann Shiversroitoru recommends that you continue on your current medications as directed. Please refer to the Current Medication list given to you today.  Labwork: NONE ORDERED  Testing/Procedures: 1. Echocardiogram - Your physician has requested that  you have an echocardiogram. Echocardiography is a painless test that uses sound waves to create images of your heart. It provides your doctor with information about the size and shape of your heart and how well your heart's chambers and valves are working. This procedure takes approximately one hour. There are no restrictions for this procedure. [facility] - [address], [address].  Follow-up: Dr Royann Shiversroitoru recommends that you schedule a follow-up appointment [facility]. You will receive a reminder letter in the mail two months in advance. If you don't receive a letter, please call our office to schedule the follow-up appointment.  If you need a refill on your cardiac medications before your next appointment, please call your pharmacy.    Signed, Thurmon FairMihai Stokely Jeancharles, MD  11/21/2015 12:35 PM    [facility] [address], [place], [place]  [address] Phone: [phone]; Fax: [phone]

## 2015-11-21 NOTE — Patient Instructions (Signed)
Medication Instructions: Dr Royann Shiversroitoru recommends that you continue on your current medications as directed. Please refer to the Current Medication list given to you today.  Labwork: NONE ORDERED  Testing/Procedures: 1. Echocardiogram - Your physician has requested that you have an echocardiogram. Echocardiography is a painless test that uses sound waves to create images of your heart. It provides your doctor with information about the size and shape of your heart and how well your heart's chambers and valves are working. This procedure takes approximately one hour. There are no restrictions for this procedure. This [facility] be performed at [facility] - [address], [address].  Follow-up: Dr Royann Shiversroitoru recommends that you schedule a follow-up appointment in [facility]. You will receive a reminder letter in the mail two months in advance. If you don't receive a letter, please call our office to schedule the follow-up appointment.  If you need a refill on your cardiac medications before your next appointment, please call your pharmacy.

## 2015-12-06 ENCOUNTER — Other Ambulatory Visit: Payer: Self-pay

## 2015-12-06 ENCOUNTER — Ambulatory Visit (HOSPITAL_COMMUNITY): Payer: Medicaid Other | Attending: Cardiology

## 2015-12-06 DIAGNOSIS — O1203 Gestational edema, third trimester: Secondary | ICD-10-CM | POA: Diagnosis not present

## 2015-12-06 DIAGNOSIS — R9431 Abnormal electrocardiogram [ECG] [EKG]: Secondary | ICD-10-CM | POA: Insufficient documentation

## 2015-12-06 DIAGNOSIS — O10919 Unspecified pre-existing hypertension complicating pregnancy, unspecified trimester: Secondary | ICD-10-CM

## 2015-12-06 DIAGNOSIS — O10913 Unspecified pre-existing hypertension complicating pregnancy, third trimester: Secondary | ICD-10-CM | POA: Insufficient documentation

## 2015-12-06 DIAGNOSIS — Z3A00 Weeks of gestation of pregnancy not specified: Secondary | ICD-10-CM | POA: Diagnosis not present

## 2015-12-28 ENCOUNTER — Encounter: Payer: Self-pay | Admitting: Cardiovascular Disease

## 2016-01-03 ENCOUNTER — Encounter (HOSPITAL_COMMUNITY): Payer: Self-pay

## 2016-01-03 ENCOUNTER — Observation Stay (HOSPITAL_COMMUNITY)
Admission: AD | Admit: 2016-01-03 | Discharge: 2016-01-04 | Disposition: A | Discharge status: Home / Self Care | Payer: Medicaid Other | Source: Ambulatory Visit | Attending: Obstetrics and Gynecology | Admitting: Obstetrics and Gynecology

## 2016-01-03 DIAGNOSIS — Z3A34 34 weeks gestation of pregnancy: Secondary | ICD-10-CM | POA: Diagnosis not present

## 2016-01-03 DIAGNOSIS — O10919 Unspecified pre-existing hypertension complicating pregnancy, unspecified trimester: Secondary | ICD-10-CM | POA: Diagnosis not present

## 2016-01-03 DIAGNOSIS — O133 Gestational [pregnancy-induced] hypertension without significant proteinuria, third trimester: Secondary | ICD-10-CM | POA: Diagnosis present

## 2016-01-03 DIAGNOSIS — O10913 Unspecified pre-existing hypertension complicating pregnancy, third trimester: Principal | ICD-10-CM | POA: Diagnosis present

## 2016-01-03 DIAGNOSIS — Z3689 Encounter for other specified antenatal screening: Secondary | ICD-10-CM | POA: Diagnosis not present

## 2016-01-03 DIAGNOSIS — O139 Gestational [pregnancy-induced] hypertension without significant proteinuria, unspecified trimester: Secondary | ICD-10-CM | POA: Diagnosis present

## 2016-01-03 LAB — CBC
HEMATOCRIT: 27.9 % — AB (ref 36.0–46.0)
Hemoglobin: 9.6 g/dL — ABNORMAL LOW (ref 12.0–15.0)
MCH: 28.7 pg (ref 26.0–34.0)
MCHC: 34.4 g/dL (ref 30.0–36.0)
MCV: 83.5 fL (ref 78.0–100.0)
PLATELETS: 320 10*3/uL (ref 150–400)
RBC: 3.34 MIL/uL — AB (ref 3.87–5.11)
RDW: 13.5 % (ref 11.5–15.5)
WBC: 7.9 10*3/uL (ref 4.0–10.5)

## 2016-01-03 LAB — COMPREHENSIVE METABOLIC PANEL
ALBUMIN: 2.7 g/dL — AB (ref 3.5–5.0)
ALT: 11 U/L — AB (ref 14–54)
AST: 15 U/L (ref 15–41)
Alkaline Phosphatase: 107 U/L (ref 38–126)
Anion gap: 6 (ref 5–15)
BILIRUBIN TOTAL: 0.1 mg/dL — AB (ref 0.3–1.2)
CHLORIDE: 104 mmol/L (ref 101–111)
CO2: 24 mmol/L (ref 22–32)
CREATININE: 0.59 mg/dL (ref 0.44–1.00)
Calcium: 8.4 mg/dL — ABNORMAL LOW (ref 8.9–10.3)
GFR calc Af Amer: >60 mL/min (ref >60)
Glucose, Bld: 92 mg/dL (ref 65–99)
POTASSIUM: 3.1 mmol/L — AB (ref 3.5–5.1)
Sodium: 134 mmol/L — ABNORMAL LOW (ref 135–145)
Total Protein: 6.4 g/dL — ABNORMAL LOW (ref 6.5–8.1)

## 2016-01-03 LAB — TYPE AND SCREEN
ABO/RH(D): O POS
Antibody Screen: NEGATIVE

## 2016-01-03 LAB — ABO/RH: ABO/RH(D): O POS

## 2016-01-03 LAB — PROTEIN / CREATININE RATIO, URINE
Creatinine, Urine: 288 mg/dL
PROTEIN CREATININE RATIO: 0.11 mg/mg{creat} (ref 0.00–0.15)
Total Protein, Urine: 32 mg/dL

## 2016-01-03 MED ORDER — ACETAMINOPHEN 325 MG PO TABS: 650.0000 mg | ORAL_TABLET | ORAL | Status: DC | PRN

## 2016-01-03 MED ORDER — NIFEDIPINE 10 MG PO CAPS: 10.0000 mg | ORAL_CAPSULE | Freq: Once | ORAL | Status: DC

## 2016-01-03 MED ORDER — DOCUSATE SODIUM 100 MG PO CAPS
100.0000 mg | ORAL_CAPSULE | Freq: Every day | ORAL | Status: DC
  Filled 2016-01-03: qty 1

## 2016-01-03 MED ORDER — LABETALOL HCL 100 MG PO TABS
100.0000 mg | ORAL_TABLET | Freq: Two times a day (BID) | ORAL | Status: DC
  Administered 2016-01-03 – 2016-01-04 (×2): 100 mg via ORAL
  Filled 2016-01-03 (×2): qty 1

## 2016-01-03 MED ORDER — HYDRALAZINE HCL 20 MG/ML IJ SOLN: 10.0000 mg | Freq: Once | INTRAMUSCULAR | Status: DC | PRN

## 2016-01-03 MED ORDER — LABETALOL HCL 5 MG/ML IV SOLN
20.0000 mg | INTRAVENOUS | Status: DC | PRN
  Administered 2016-01-03: 20 mg via INTRAVENOUS
  Filled 2016-01-03: qty 4

## 2016-01-03 MED ORDER — PRENATAL MULTIVITAMIN CH
1.0000 | ORAL_TABLET | Freq: Every day | ORAL | Status: DC
  Administered 2016-01-03 – 2016-01-04 (×2): 1 via ORAL
  Filled 2016-01-03 (×2): qty 1

## 2016-01-03 MED ORDER — CALCIUM CARBONATE ANTACID 500 MG PO CHEW: 2.0000 | CHEWABLE_TABLET | ORAL | Status: DC | PRN

## 2016-01-03 NOTE — H&P (Signed)
30 y.o. G2P1001 @ 9123w5d presents with from the office with acute elevation of blood pressure.  She has a history of chronic hypertension that was diagnosed during her first pregnancy.  She was on medication until approximately one year ago at which time she self-discontinued.  BP in the first trimester was 140s/80-90s.  Since has been running 120-130/70-80s.  At a routine prenatal visit today, BP was 160-170/90s.  She denies HA/BV/RUQ or epigastric pain.  Otherwise has good fetal movement and no bleeding.  On arrival to MAU, she continued to have severe range BPs.  The anti-hypertensive protocol was ordered, but difficulty was encountered with establishing an IV.  When I was alerted, I ordered 10mg  PO nifedipine.  By that time, an IV was established and the patient received 20mg  IV labetalol.   At this time, she denies any HA / BV / RUQ or epigastric pain.  She endorses good fetal movement.      Past Medical History:  Diagnosis Date  . Elevated AST (SGOT)    07/21/11=39; previously normal on admission and 07/10/11  . H/O varicella   . Hypertension    no meds    Past Surgical History:  Procedure Laterality Date  . CHOLECYSTECTOMY N/A 09/2011  . LAPAROSCOPY N/A    Remove gallstones    OB History  Gravida Para Term Preterm AB Living  2 1 1     1   SAB TAB Ectopic Multiple Live Births          1    # Outcome Date GA Lbr Len/2nd Weight Sex Delivery Anes PTL Lv  2 Current           1 Term 07/19/11 6527w6d 16:10 / 02:43  F Vag-Spont EPI  LIV      Social History   Social History  . Marital status: Single    Spouse name: N/A  . Number of children: N/A  . Years of education: N/A   Occupational History  . Not on file.   Social History Main Topics  . Smoking status: Never Smoker  . Smokeless tobacco: Never Used  . Alcohol use No  . Drug use: No  . Sexual activity: Yes    Birth control/ protection: None   Other Topics Concern  . Not on file   Social History Narrative  . No  narrative on file   Pollen extract    Prenatal Transfer Tool  Maternal Diabetes: No Genetic Screening: Normal Maternal Ultrasounds/Referrals: Normal Fetal Ultrasounds or other Referrals:  None Maternal Substance Abuse:  No Significant Maternal Medications:  None Significant Maternal Lab Results: None  ABO, Rh: --/--/O POS (11/21 1356) Antibody: NEG (11/21 1356)    Vitals:   01/03/16 1347 01/03/16 1610  BP: 137/78 140/70  Pulse: 88 89  Resp: 20 18  Temp: 99.2 F (37.3 C) 98.7 F (37.1 C)     General:  NAD Abdomen:  soft, gravid SVE: deferred FHTs:  140s, mod var, + accels, no decels Toco:  quiet  Lab Results  Component Value Date   WBC 7.9 01/03/2016   HGB 9.6 (L) 01/03/2016   HCT 27.9 (L) 01/03/2016   MCV 83.5 01/03/2016   PLT 320 01/03/2016   CMP     Component Value Date/Time   NA 134 (L) 01/03/2016 1155   K 3.1 (L) 01/03/2016 1155   CL 104 01/03/2016 1155   CO2 24 01/03/2016 1155   GLUCOSE 92 01/03/2016 1155   BUN <5 (L) 01/03/2016 1155  CREATININE 0.59 01/03/2016 1155   CREATININE 0.84 07/10/2011 1649   CALCIUM 8.4 (L) 01/03/2016 1155   PROT 6.4 (L) 01/03/2016 1155   ALBUMIN 2.7 (L) 01/03/2016 1155   AST 15 01/03/2016 1155   ALT 11 (L) 01/03/2016 1155   ALKPHOS 107 01/03/2016 1155   BILITOT 0.1 (L) 01/03/2016 1155   GFRNONAA >60 01/03/2016 1155   GFRAA >60 01/03/2016 1155   Urine protein: creatinine 0.11   A/P   30 y.o. G2P1001 880w5d presents with severe range BPs Admit to AP for 23hr obs Preeclampsia labs negative Suspect acute worsening of underlying chronic hypertension.  BPs now mild range s/p IV labetalol.  Will start on labetalol 100mg  PO BID and titrate up prn Daily NST SCDs   [facility] GEFFEL Keiden Deskin

## 2016-01-03 NOTE — MAU Note (Signed)
1 IV attempt by Ginnie Smartachel Telitha Plath RN, IV would not thread.

## 2016-01-03 NOTE — MAU Provider Note (Signed)
History     CSN: [id number]  Arrival date and time: 01/03/16 1110   First Provider Initiated Contact with Patient 01/03/16 1137      Chief Complaint  Patient presents with  . Hypertension   G2P1001 @34 .5 weeks sent from office for elevated BP. She denies HA, visual disturbances, and epigastric pain. She reports good FM. No VB, LOF, or ctx. She has hx of chronic HTN and has not been on meds during the pregnancy.     OB History    Gravida Para Term Preterm AB Living   2 1 1     1    SAB TAB Ectopic Multiple Live Births           1      Past Medical History:  Diagnosis Date  . Elevated AST (SGOT)    07/21/11=39; previously normal on admission and 07/10/11  . GERD (gastroesophageal reflux disease)   . H/O cystitis   . H/O varicella   . Hypertension    no meds  . Postpartum anemia 07/21/2011   Hgb=8.4 on 6/8 (11.1) on admission  . UTI (urinary tract infection) following delivery 07/21/2011   Cath u/a with positive nitrites 07/21/11; per c/w Dr. Estanislado Pandyivard, will treat w/ Macrobid po bid x7 day.  Culture sent.  . Yeast infection     Past Surgical History:  Procedure Laterality Date  . NO PAST SURGERIES      Family History  Problem Relation Age of Onset  . Hypertension Mother   . Stroke Maternal Uncle   . Diabetes Maternal Grandmother   . Hypertension Maternal Grandfather   . Mental illness Paternal Grandfather   . Asthma Cousin   . Anesthesia problems Neg Hx   . Hypotension Neg Hx   . Malignant hyperthermia Neg Hx   . Pseudochol deficiency Neg Hx     Social History  Substance Use Topics  . Smoking status: Never Smoker  . Smokeless tobacco: Never Used  . Alcohol use No    Allergies:  Allergies  Allergen Reactions  . Pollen Extract Itching and Swelling    Prescriptions Prior to Admission  Medication Sig Dispense Refill Last Dose  . acetaminophen (TYLENOL) 325 MG tablet Take 650 mg by mouth every 6 (six) hours as needed.   Taking  . Prenatal Multivit-Min-Fe-FA  (PRE-NATAL FORMULA) TABS Take 1 tablet by mouth daily.   Taking    Review of Systems  Constitutional: Negative.   Respiratory: Negative.   Cardiovascular: Negative.   Gastrointestinal: Negative.    Physical Exam   Blood pressure 160/100, pulse 114, temperature 98.3 F (36.8 C), resp. rate 18, last menstrual period 05/05/2015, unknown if currently breastfeeding.  Vitals:   01/03/16 1224 01/03/16 1228 01/03/16 1304 01/03/16 1313  BP: 162/99 165/93 145/88 149/80  Pulse: 102 100 94 80  Resp:      Temp:        Physical Exam  Constitutional: She appears well-developed and well-nourished. No distress.  HENT:  Head: Normocephalic and atraumatic.  Neck: Normal range of motion. Neck supple.  Cardiovascular: Normal rate.   Respiratory: Effort normal.  GI: Soft. She exhibits no distension. There is no tenderness.  Musculoskeletal: Normal range of motion.  Neurological: She has normal reflexes.  Skin: Skin is warm and dry.  Psychiatric: She has a normal mood and affect.   EFM: 140 bpm, mod variability, + accels, no decels Toco: irritability  Results for orders placed or performed during the hospital encounter of 01/03/16 (from the  past 24 hour(s))  Protein / creatinine ratio, urine     Status: None   Collection Time: 01/03/16 11:24 AM  Result Value Ref Range   Creatinine, Urine 288.00 mg/dL   Total Protein, Urine 32 mg/dL   Protein Creatinine Ratio 0.11 0.00 - 0.15 mg/mg[Cre]  CBC     Status: Abnormal   Collection Time: 01/03/16 11:55 AM  Result Value Ref Range   WBC 7.9 4.0 - 10.5 K/uL   RBC 3.34 (L) 3.87 - 5.11 MIL/uL   Hemoglobin 9.6 (L) 12.0 - 15.0 g/dL   HCT 95.227.9 (L) 84.136.0 - 32.446.0 %   MCV 83.5 78.0 - 100.0 fL   MCH 28.7 26.0 - 34.0 pg   MCHC 34.4 30.0 - 36.0 g/dL   RDW 40.113.5 02.711.5 - 25.315.5 %   Platelets 320 150 - 400 K/uL  Comprehensive metabolic panel     Status: Abnormal   Collection Time: 01/03/16 11:55 AM  Result Value Ref Range   Sodium 134 (L) 135 - 145 mmol/L    Potassium 3.1 (L) 3.5 - 5.1 mmol/L   Chloride 104 101 - 111 mmol/L   CO2 24 22 - 32 mmol/L   Glucose, Bld 92 65 - 99 mg/dL   BUN <5 (L) 6 - 20 mg/dL   Creatinine, Ser 6.640.59 0.44 - 1.00 mg/dL   Calcium 8.4 (L) 8.9 - 10.3 mg/dL   Total Protein 6.4 (L) 6.5 - 8.1 g/dL   Albumin 2.7 (L) 3.5 - 5.0 g/dL   AST 15 15 - 41 U/L   ALT 11 (L) 14 - 54 U/L   Alkaline Phosphatase 107 38 - 126 U/L   Total Bilirubin 0.1 (L) 0.3 - 1.2 mg/dL   GFR calc non Af Amer >60 >60 mL/min   GFR calc Af Amer >60 >60 mL/min   Anion gap 6 5 - 15    MAU Course  Procedures Labetalol 20 mg IV  MDM Labs ordered and reviewed. Presentation, clinical findings, and plan discussed with Dr. Chestine Sporelark. Plan for admit and BP mngt.   Assessment and Plan  34.[redacted] weeks gestation Chronic HTN Reactive NST  Admit to Ante unit Mngt per Dr. Tiana Loftlark  Milo Solana, CNM 01/03/2016, 11:47 AM

## 2016-01-03 NOTE — MAU Note (Signed)
Pt sent from office for elevated BP

## 2016-01-03 NOTE — Progress Notes (Signed)
Pt sitting up eating, FHR not tracing well secondary maternal position.

## 2016-01-04 DIAGNOSIS — O139 Gestational [pregnancy-induced] hypertension without significant proteinuria, unspecified trimester: Secondary | ICD-10-CM | POA: Diagnosis present

## 2016-01-04 MED ORDER — SODIUM CHLORIDE 0.9% FLUSH
3.0000 mL | Freq: Two times a day (BID) | INTRAVENOUS | Status: DC
  Administered 2016-01-04: 3 mL via INTRAVENOUS

## 2016-01-04 MED ORDER — LABETALOL HCL 100 MG PO TABS: 100.0000 mg | ORAL_TABLET | Freq: Two times a day (BID) | ORAL | 2 refills | Status: DC

## 2016-01-04 NOTE — Discharge Summary (Signed)
Obstetric Discharge Summary Reason for Admission: Elevated Blood Pressures Prenatal Procedures: NST and ultrasound Intrapartum Procedures: NA, undelivered Postpartum Procedures: NA, Undelivered Complications-Operative and Postpartum: NA, Undelivered Hemoglobin  Date Value Ref Range Status  01/03/2016 9.6 (L) 12.0 - 15.0 g/dL Final   HCT  Date Value Ref Range Status  01/03/2016 27.9 (L) 36.0 - 46.0 % Final    Physical Exam:  Vitals:   01/04/16 0320 01/04/16 0633 01/04/16 0748 01/04/16 0932  BP: (!) 142/79 120/69 140/63 140/63  Pulse: 96 96 (!) 101 (!) 101  Resp: 18 18 20    Temp: 98.5 F (36.9 C)  98.1 F (36.7 C)   TempSrc: Oral  Oral   Weight:      Height:        General: alert, cooperative and appears stated age NST reactive, category 1 tracing  Discharge Diagnoses: Chronic hypertension in pregnancy  Discharge Information: Date: 01/04/2016 Activity: unrestricted Diet: routine Medications: Labetalol 100mg  BID Condition: improved Instructions: refer to practice specific booklet Discharge to: home   Stacey Sherrard H. 01/04/2016, 9:45 AM

## 2016-01-04 NOTE — Discharge Instructions (Signed)
Hypertension During Pregnancy Hypertension is also called high blood pressure. High blood pressure means that the force of your blood moving in your body is too strong. When you are pregnant, this condition should be watched carefully. It can cause problems for you and your baby. Follow these instructions at home: Eating and drinking  Drink enough fluid to keep your pee (urine) clear or pale yellow.  Eat healthy foods that are low in salt (sodium).  Do not add salt to your food.  Check labels on foods and drinks to see much salt is in them. Look on the label where you see "Sodium." Lifestyle  Do not use any products that contain nicotine or tobacco, such as cigarettes and e-cigarettes. If you need help quitting, ask your doctor.  Do not use alcohol.  Avoid caffeine.  Avoid stress. Rest and get plenty of sleep. General instructions  Take over-the-counter and prescription medicines only as told by your doctor.  While lying down, lie on your left side. This keeps pressure off your baby.  While sitting or lying down, raise (elevate) your feet. Try putting some pillows under your lower legs.  Exercise regularly. Ask your doctor what kinds of exercise are best for you.  Keep all prenatal and follow-up visits as told by your doctor. This is important. Contact a doctor if:  You have symptoms that your doctor told you to watch for, such as:  Fever.  Throwing up (vomiting).  Headache. Get help right away if:  You have very bad pain in your belly (abdomen).  You are throwing up, and this does not get better with treatment.  You suddenly get swelling in your hands, ankles, or face.  You gain 4 lb (1.8 kg) or more in 1 week.  You get bleeding from your vagina.  You have blood in your pee.  You do not feel your baby moving as much as normal.  You have a change in vision.  You have muscle twitching or sudden tightening (spasms).  You have trouble breathing.  Your lips  or fingernails turn blue. This information is not intended to replace advice given to you by your health care provider. Make sure you discuss any questions you have with your health care provider. Document Released: 03/03/2010 Document Revised: 10/11/2015 Document Reviewed: 10/11/2015 Elsevier Interactive Patient Education  2017 [employer]  Introduction Patient Name: ________________________________________________ Patient Due Date: ____________________ What is a fetal movement count? A fetal movement count is the number of times that you feel your baby move during a certain amount of time. This may also be called a fetal kick count. A fetal movement count is recommended for every pregnant woman. You may be asked to start counting fetal movements as early as week 28 of your pregnancy. Pay attention to when your baby is most active. You may notice your baby's sleep and wake cycles. You may also notice things that make your baby move more. You should do a fetal movement count:  When your baby is normally most active.  At the same time each day. A good time to count movements is while you are resting, after having something to eat and drink. How do I count fetal movements? 1. Find a quiet, comfortable area. Sit, or lie down on your side. 2. Write down the date, the start time and stop time, and the number of movements that you felt between those two times. Take this information with you to your health care visits. 3. For 2 hours,  count kicks, flutters, swishes, rolls, and jabs. You should feel at least 10 movements during 2 hours. 4. You may stop counting after you have felt 10 movements. 5. If you do not feel 10 movements in 2 hours, have something to eat and drink. Then, keep resting and counting for 1 hour. If you feel at least 4 movements during that hour, you may stop counting. Contact a health care provider if:  You feel fewer than 4 movements in 2 hours.  Your baby is not moving like  he or she usually does. Date: ____________ Start time: ____________ Stop time: ____________ Movements: ____________ Date: ____________ Start time: ____________ Stop time: ____________ Movements: ____________ Date: ____________ Start time: ____________ Stop time: ____________ Movements: ____________ Date: ____________ Start time: ____________ Stop time: ____________ Movements: ____________ Date: ____________ Start time: ____________ Stop time: ____________ Movements: ____________ Date: ____________ Start time: ____________ Stop time: ____________ Movements: ____________ Date: ____________ Start time: ____________ Stop time: ____________ Movements: ____________ Date: ____________ Start time: ____________ Stop time: ____________ Movements: ____________ Date: ____________ Start time: ____________ Stop time: ____________ Movements: ____________ This information is not intended to replace advice given to you by your health care provider. Make sure you discuss any questions you have with your health care provider. Document Released: 02/28/2006 Document Revised: 09/28/2015 Document Reviewed: 03/10/2015 Elsevier Interactive Patient Education  2017 [employer]

## 2016-01-11 LAB — OB RESULTS CONSOLE GBS: STREP GROUP B AG: NEGATIVE

## 2016-01-23 ENCOUNTER — Telehealth (HOSPITAL_COMMUNITY): Payer: Self-pay | Admitting: *Deleted

## 2016-01-23 NOTE — Telephone Encounter (Signed)
Preadmission screen  

## 2016-01-26 ENCOUNTER — Inpatient Hospital Stay (HOSPITAL_COMMUNITY): Payer: Medicaid Other | Admitting: Anesthesiology

## 2016-01-26 ENCOUNTER — Inpatient Hospital Stay (HOSPITAL_COMMUNITY)
Admission: RE | Admit: 2016-01-26 | Discharge: 2016-01-28 | DRG: 774 | Disposition: A | Discharge status: Home / Self Care | Payer: Medicaid Other | Source: Ambulatory Visit | Attending: Obstetrics | Admitting: Obstetrics

## 2016-01-26 ENCOUNTER — Encounter (HOSPITAL_COMMUNITY): Payer: Self-pay

## 2016-01-26 DIAGNOSIS — D649 Anemia, unspecified: Secondary | ICD-10-CM | POA: Diagnosis present

## 2016-01-26 DIAGNOSIS — O10913 Unspecified pre-existing hypertension complicating pregnancy, third trimester: Secondary | ICD-10-CM | POA: Diagnosis present

## 2016-01-26 DIAGNOSIS — O1002 Pre-existing essential hypertension complicating childbirth: Principal | ICD-10-CM | POA: Diagnosis present

## 2016-01-26 DIAGNOSIS — Z3A38 38 weeks gestation of pregnancy: Secondary | ICD-10-CM | POA: Diagnosis not present

## 2016-01-26 DIAGNOSIS — O9081 Anemia of the puerperium: Secondary | ICD-10-CM | POA: Diagnosis present

## 2016-01-26 LAB — CBC
HCT: 29.9 % — ABNORMAL LOW (ref 36.0–46.0)
HCT: 30.5 % — ABNORMAL LOW (ref 36.0–46.0)
HEMOGLOBIN: 10.4 g/dL — AB (ref 12.0–15.0)
Hemoglobin: 10.1 g/dL — ABNORMAL LOW (ref 12.0–15.0)
MCH: 27.5 pg (ref 26.0–34.0)
MCH: 27.9 pg (ref 26.0–34.0)
MCHC: 33.8 g/dL (ref 30.0–36.0)
MCHC: 34.1 g/dL (ref 30.0–36.0)
MCV: 81.5 fL (ref 78.0–100.0)
MCV: 81.8 fL (ref 78.0–100.0)
PLATELETS: 338 10*3/uL (ref 150–400)
PLATELETS: 315 10*3/uL (ref 150–400)
RBC: 3.67 MIL/uL — AB (ref 3.87–5.11)
RBC: 3.73 MIL/uL — ABNORMAL LOW (ref 3.87–5.11)
RDW: 13.7 % (ref 11.5–15.5)
RDW: 13.8 % (ref 11.5–15.5)
WBC: 6.1 10*3/uL (ref 4.0–10.5)
WBC: 11.2 10*3/uL — ABNORMAL HIGH (ref 4.0–10.5)

## 2016-01-26 LAB — TYPE AND SCREEN
ABO/RH(D): O POS
Antibody Screen: NEGATIVE

## 2016-01-26 LAB — RPR: RPR Ser Ql: NONREACTIVE

## 2016-01-26 MED ORDER — ONDANSETRON HCL 4 MG/2ML IJ SOLN: 4.0000 mg | Freq: Four times a day (QID) | INTRAMUSCULAR | Status: DC | PRN

## 2016-01-26 MED ORDER — SENNOSIDES-DOCUSATE SODIUM 8.6-50 MG PO TABS
2.0000 | ORAL_TABLET | ORAL | Status: DC
  Administered 2016-01-26 – 2016-01-27 (×2): 2 via ORAL
  Filled 2016-01-26 (×2): qty 2

## 2016-01-26 MED ORDER — PRENATAL MULTIVITAMIN CH
1.0000 | ORAL_TABLET | Freq: Every day | ORAL | Status: DC
  Administered 2016-01-27 – 2016-01-28 (×2): 1 via ORAL
  Filled 2016-01-26 (×2): qty 1

## 2016-01-26 MED ORDER — TERBUTALINE SULFATE 1 MG/ML IJ SOLN
0.2500 mg | Freq: Once | INTRAMUSCULAR | Status: DC | PRN
  Filled 2016-01-26: qty 1

## 2016-01-26 MED ORDER — LABETALOL HCL 100 MG PO TABS
100.0000 mg | ORAL_TABLET | Freq: Two times a day (BID) | ORAL | Status: DC
  Administered 2016-01-26 – 2016-01-28 (×4): 100 mg via ORAL
  Filled 2016-01-26 (×4): qty 1

## 2016-01-26 MED ORDER — OXYTOCIN 40 UNITS IN LACTATED RINGERS INFUSION - SIMPLE MED
1.0000 m[IU]/min | INTRAVENOUS | Status: DC
  Administered 2016-01-26: 2 m[IU]/min via INTRAVENOUS

## 2016-01-26 MED ORDER — OXYTOCIN BOLUS FROM INFUSION
500.0000 mL | Freq: Once | INTRAVENOUS | Status: AC
  Administered 2016-01-26: 500 mL via INTRAVENOUS

## 2016-01-26 MED ORDER — FENTANYL 2.5 MCG/ML BUPIVACAINE 1/10 % EPIDURAL INFUSION (WH - ANES)
14.0000 mL/h | INTRAMUSCULAR | Status: DC | PRN
  Administered 2016-01-26 (×2): 14 mL/h via EPIDURAL
  Filled 2016-01-26: qty 100

## 2016-01-26 MED ORDER — TETANUS-DIPHTH-ACELL PERTUSSIS 5-2.5-18.5 LF-MCG/0.5 IM SUSP: 0.5000 mL | Freq: Once | INTRAMUSCULAR | Status: DC

## 2016-01-26 MED ORDER — FENTANYL CITRATE (PF) 100 MCG/2ML IJ SOLN: 50.0000 ug | INTRAMUSCULAR | Status: DC | PRN

## 2016-01-26 MED ORDER — EPHEDRINE 5 MG/ML INJ: 10.0000 mg | INTRAVENOUS | Status: DC | PRN

## 2016-01-26 MED ORDER — LACTATED RINGERS IV SOLN
INTRAVENOUS | Status: DC
  Administered 2016-01-26 (×2): via INTRAVENOUS

## 2016-01-26 MED ORDER — COCONUT OIL OIL: 1.0000 "application " | TOPICAL_OIL | Status: DC | PRN

## 2016-01-26 MED ORDER — PHENYLEPHRINE 40 MCG/ML (10ML) SYRINGE FOR IV PUSH (FOR BLOOD PRESSURE SUPPORT)
80.0000 ug | PREFILLED_SYRINGE | INTRAVENOUS | Status: DC | PRN
  Filled 2016-01-26: qty 5
  Filled 2016-01-26: qty 10

## 2016-01-26 MED ORDER — ACETAMINOPHEN 325 MG PO TABS
650.0000 mg | ORAL_TABLET | ORAL | Status: DC | PRN
  Administered 2016-01-26: 650 mg via ORAL
  Filled 2016-01-26: qty 2

## 2016-01-26 MED ORDER — ONDANSETRON HCL 4 MG PO TABS: 4.0000 mg | ORAL_TABLET | ORAL | Status: DC | PRN

## 2016-01-26 MED ORDER — PHENYLEPHRINE 40 MCG/ML (10ML) SYRINGE FOR IV PUSH (FOR BLOOD PRESSURE SUPPORT): 80.0000 ug | PREFILLED_SYRINGE | INTRAVENOUS | Status: DC | PRN

## 2016-01-26 MED ORDER — SIMETHICONE 80 MG PO CHEW: 80.0000 mg | CHEWABLE_TABLET | ORAL | Status: DC | PRN

## 2016-01-26 MED ORDER — DIPHENHYDRAMINE HCL 50 MG/ML IJ SOLN: 12.5000 mg | INTRAMUSCULAR | Status: DC | PRN

## 2016-01-26 MED ORDER — ONDANSETRON HCL 4 MG/2ML IJ SOLN: 4.0000 mg | INTRAMUSCULAR | Status: DC | PRN

## 2016-01-26 MED ORDER — OXYCODONE-ACETAMINOPHEN 5-325 MG PO TABS: 1.0000 | ORAL_TABLET | ORAL | Status: DC | PRN

## 2016-01-26 MED ORDER — PHENYLEPHRINE 40 MCG/ML (10ML) SYRINGE FOR IV PUSH (FOR BLOOD PRESSURE SUPPORT)
80.0000 ug | PREFILLED_SYRINGE | INTRAVENOUS | Status: DC | PRN
  Filled 2016-01-26: qty 5

## 2016-01-26 MED ORDER — OXYCODONE-ACETAMINOPHEN 5-325 MG PO TABS: 2.0000 | ORAL_TABLET | ORAL | Status: DC | PRN

## 2016-01-26 MED ORDER — OXYTOCIN 40 UNITS IN LACTATED RINGERS INFUSION - SIMPLE MED: 2.5000 [IU]/h | INTRAVENOUS | Status: DC

## 2016-01-26 MED ORDER — OXYCODONE HCL 5 MG PO TABS: 5.0000 mg | ORAL_TABLET | ORAL | Status: DC | PRN

## 2016-01-26 MED ORDER — LACTATED RINGERS IV SOLN
500.0000 mL | Freq: Once | INTRAVENOUS | Status: AC
  Administered 2016-01-26: 500 mL via INTRAVENOUS

## 2016-01-26 MED ORDER — EPHEDRINE 5 MG/ML INJ
10.0000 mg | INTRAVENOUS | Status: DC | PRN
  Filled 2016-01-26: qty 4

## 2016-01-26 MED ORDER — ACETAMINOPHEN 325 MG PO TABS: 650.0000 mg | ORAL_TABLET | ORAL | Status: DC | PRN

## 2016-01-26 MED ORDER — LACTATED RINGERS IV SOLN: 500.0000 mL | Freq: Once | INTRAVENOUS | Status: DC

## 2016-01-26 MED ORDER — WITCH HAZEL-GLYCERIN EX PADS: 1.0000 "application " | MEDICATED_PAD | CUTANEOUS | Status: DC | PRN

## 2016-01-26 MED ORDER — LIDOCAINE HCL (PF) 1 % IJ SOLN
30.0000 mL | INTRAMUSCULAR | Status: DC | PRN
  Filled 2016-01-26: qty 30

## 2016-01-26 MED ORDER — OXYCODONE HCL 5 MG PO TABS: 10.0000 mg | ORAL_TABLET | ORAL | Status: DC | PRN

## 2016-01-26 MED ORDER — SOD CITRATE-CITRIC ACID 500-334 MG/5ML PO SOLN: 30.0000 mL | ORAL | Status: DC | PRN

## 2016-01-26 MED ORDER — LIDOCAINE HCL (PF) 1 % IJ SOLN
INTRAMUSCULAR | Status: DC | PRN
  Administered 2016-01-26 (×2): 6 mL via EPIDURAL

## 2016-01-26 MED ORDER — BENZOCAINE-MENTHOL 20-0.5 % EX AERO: 1.0000 "application " | INHALATION_SPRAY | CUTANEOUS | Status: DC | PRN

## 2016-01-26 MED ORDER — IBUPROFEN 600 MG PO TABS
600.0000 mg | ORAL_TABLET | Freq: Four times a day (QID) | ORAL | Status: DC
  Administered 2016-01-26 – 2016-01-28 (×7): 600 mg via ORAL
  Filled 2016-01-26 (×7): qty 1

## 2016-01-26 MED ORDER — DIBUCAINE 1 % RE OINT: 1.0000 "application " | TOPICAL_OINTMENT | RECTAL | Status: DC | PRN

## 2016-01-26 MED ORDER — LACTATED RINGERS IV SOLN: 500.0000 mL | INTRAVENOUS | Status: DC | PRN

## 2016-01-26 MED ORDER — DIPHENHYDRAMINE HCL 25 MG PO CAPS: 25.0000 mg | ORAL_CAPSULE | Freq: Four times a day (QID) | ORAL | Status: DC | PRN

## 2016-01-26 NOTE — H&P (Signed)
30 y.o. G2P1001 @ 23110w0d presents for IOL for worsening CHTN at term.  She was admitted with worsening hypertension at 34 weeks and started on labetalol PO.  Her BPs have been mostly stable in the 140s/80s, but have been trending up over the past few weeks.  Otherwise has good fetal movement and no bleeding.   Past Medical History:  Diagnosis Date  . H/O varicella   . Hypertension    no meds    Past Surgical History:  Procedure Laterality Date  . CHOLECYSTECTOMY N/A 09/2011  . LAPAROSCOPY N/A    Remove gallstones    OB History  Gravida Para Term Preterm AB Living  2 1 1     1   SAB TAB Ectopic Multiple Live Births          1    # Outcome Date GA Lbr Len/2nd Weight Sex Delivery Anes PTL Lv  2 Current           1 Term 07/19/11 3757w6d 16:10 / 02:43 3.175 kg (7 lb) F Vag-Spont EPI N LIV      Social History   Social History  . Marital status: Single    Spouse name: N/A  . Number of children: N/A  . Years of education: N/A   Occupational History  . Not on file.   Social History Main Topics  . Smoking status: Never Smoker  . Smokeless tobacco: Never Used  . Alcohol use No  . Drug use: No  . Sexual activity: Yes    Birth control/ protection: None   Other Topics Concern  . Not on file   Social History Narrative  . No narrative on file   Pollen extract    Prenatal Transfer Tool  Maternal Diabetes: No Genetic Screening: Normal Maternal Ultrasounds/Referrals: Normal Fetal Ultrasounds or other Referrals:  None Maternal Substance Abuse:  No Significant Maternal Medications:  Meds include: Other: labetalol Significant Maternal Lab Results: None  ABO, Rh: --/--/O POS, O POS (11/21 1356) Antibody: NEG (11/21 1356) Rubella: Immune (06/16 0000) RPR: Nonreactive (06/16 0000)  HBsAg: Negative (06/16 0000)  HIV: Non-reactive (06/16 0000)  GBS: Negative (11/29 0000)    Vitals:   01/26/16 0841 01/26/16 0930  BP: (!) 144/73 127/73  Pulse: 96 95  Resp: 18 18      General:  NAD Abdomen:  soft, gravid, EFW 7# Ex:  no edema SVE:  3/50/-3/vtx FHTs:  130s, mod var, + accels, no decels Toco:  Rare ctx   A/P   30 y.o. G2P1001 39110w0d presents for IOL for CHTN at term IOL--cervix favorable--will start pitocin CHTN: on labetalol 100mg  po bid.  No s/sx preeclampsia  FSR/ vtx/ GBS neg  Tacy Chavis GEFFEL Cadie Sorci

## 2016-01-26 NOTE — Anesthesia Procedure Notes (Signed)
Epidural Patient location during procedure: OB Start time: 01/26/2016 11:53 AM End time: 01/26/2016 12:15 PM  Staffing Anesthesiologist: Jairo BenJACKSON, Mayla Biddy Performed: anesthesiologist   Preanesthetic Checklist Completed: patient identified, surgical consent, pre-op evaluation, timeout performed, IV checked, risks and benefits discussed and monitors and equipment checked  Epidural Patient position: sitting Prep: site prepped and draped and DuraPrep Patient monitoring: blood pressure, continuous pulse ox and heart rate Approach: midline Location: L2-L3 Injection technique: LOR air  Needle:  Needle type: Tuohy  Needle gauge: 17 G Needle length: 9 cm Needle insertion depth: 7.5 cm Catheter type: closed end flexible Catheter size: 19 Gauge Catheter at skin depth: 11.5 cm Test dose: negative (1% lidocaine)  Assessment Events: blood not aspirated, injection not painful, no injection resistance, negative IV test and no paresthesia  Additional Notes Pt identified in Labor room.  Monitors applied. Working IV access confirmed. Sterile prep, drape lumbar spine.  1% lido local L 2,3.  #17ga Touhy LOR air at 7.5 cm L 2,3, cath in easily to 11.5 cm skin. Test dose OK, cath dosed and infusion begun.  Patient asymptomatic, VSS, no heme aspirated, tolerated well.  Sandford Craze Conchita Truxillo, MDReason for block:procedure for pain

## 2016-01-26 NOTE — Anesthesia Pain Management Evaluation Note (Signed)
  CRNA Pain Management Visit Note  Patient: Stacey Levine, 30 y.o., female  "Hello I am a member of the anesthesia [facility]. We have an anesthesia team available at all times to provide care throughout the hospital, including epidural management and anesthesia for C-section. I don't know your plan for the delivery whether it a natural birth, water birth, IV sedation, nitrous supplementation, doula or epidural, but we want to meet your pain goals."   1.Was your pain managed to your expectations on prior hospitalizations?   Yes   2.What is your expectation for pain management during this hospitalization?     Epidural  3.How can we help you reach that goal? Epidural when my pain reaches a 5.  Record the patient's initial score and the patient's pain goal.   Pain: 1  Pain Goal: 2 [facility] wants you to be able to say your pain was always managed very well.  Jayden Rudge 01/26/2016

## 2016-01-26 NOTE — Anesthesia Preprocedure Evaluation (Signed)
Anesthesia Evaluation  Patient identified by MRN, date of birth, ID band Patient awake    Reviewed: Allergy & Precautions, NPO status , Patient's Chart, lab work & pertinent test results  History of Anesthesia Complications Negative for: history of anesthetic complications  Airway Mallampati: III  TM Distance: >3 FB Neck ROM: Full    Dental  (+) Dental Advisory Given   Pulmonary neg pulmonary ROS,    breath sounds clear to auscultation       Cardiovascular hypertension (PIH), Pt. on medications  Rhythm:Regular Rate:Normal     Neuro/Psych negative neurological ROS     GI/Hepatic Neg liver ROS, GERD  Poorly Controlled,  Endo/Other  Morbid obesity  Renal/GU negative Renal ROS     Musculoskeletal   Abdominal (+) + obese,   Peds  Hematology plt 338K   Anesthesia Other Findings   Reproductive/Obstetrics                             Anesthesia Physical Anesthesia Plan  ASA: III  Anesthesia Plan: Epidural   Post-op Pain Management:    Induction:   Airway Management Planned:   Additional Equipment:   Intra-op Plan:   Post-operative Plan:   Informed Consent: I have reviewed the patients History and Physical, chart, labs and discussed the procedure including the risks, benefits and alternatives for the proposed anesthesia with the patient or authorized representative who has indicated his/her understanding and acceptance.   Dental advisory given  Plan Discussed with:   Anesthesia Plan Comments: (Patient identified. Risks/Benefits/Options discussed with patient including but not limited to bleeding, infection, nerve damage, paralysis, failed block, incomplete pain control, headache, blood pressure changes, nausea, vomiting, reactions to medication both or allergic, itching and postpartum back pain. Confirmed with bedside nurse the patient's most recent platelet count. Confirmed with  patient that they are not currently taking any anticoagulation, have any bleeding history or any family history of bleeding disorders. Patient expressed understanding and wished to proceed. All questions were answered. )        Anesthesia Quick Evaluation

## 2016-01-27 LAB — CBC
HCT: 26.7 % — ABNORMAL LOW (ref 36.0–46.0)
Hemoglobin: 8.8 g/dL — ABNORMAL LOW (ref 12.0–15.0)
MCH: 26.8 pg (ref 26.0–34.0)
MCHC: 33 g/dL (ref 30.0–36.0)
MCV: 81.4 fL (ref 78.0–100.0)
PLATELETS: 279 10*3/uL (ref 150–400)
RBC: 3.28 MIL/uL — ABNORMAL LOW (ref 3.87–5.11)
RDW: 13.9 % (ref 11.5–15.5)
WBC: 12.2 10*3/uL — AB (ref 4.0–10.5)

## 2016-01-27 NOTE — Progress Notes (Signed)
UR chart review completed.  

## 2016-01-27 NOTE — Anesthesia Postprocedure Evaluation (Signed)
Anesthesia Post Note  Patient: Stacey Levine  Procedure(s) Performed: * No procedures listed *  Patient location during evaluation: Mother Baby Anesthesia Type: Epidural Level of consciousness: awake and alert Pain management: pain level controlled Vital Signs Assessment: post-procedure vital signs reviewed and stable Respiratory status: spontaneous breathing, nonlabored ventilation and respiratory function stable Cardiovascular status: stable Postop Assessment: no headache, no backache and epidural receding Anesthetic complications: no     Last Vitals:  Vitals:   01/26/16 2150 01/27/16 0246  BP: (!) 162/75 140/74  Pulse: 92 85  Resp: 18   Temp: 36.8 C     Last Pain:  Vitals:   01/27/16 0541  TempSrc:   PainSc: 0-No pain   Pain Goal:                 Junious SilkGILBERT,Kongmeng Santoro

## 2016-01-27 NOTE — Progress Notes (Signed)
PPD#1 Pt without complaints. Lochia-wnl VS- a few elevated systolic B/Ps . Nl diastolic. Will follow Imp/Stable Plan/ Routine care/

## 2016-01-28 ENCOUNTER — Encounter (HOSPITAL_COMMUNITY): Payer: Self-pay

## 2016-01-28 MED ORDER — IBUPROFEN 600 MG PO TABS: 600.0000 mg | ORAL_TABLET | Freq: Four times a day (QID) | ORAL | 0 refills | Status: AC

## 2016-01-28 MED ORDER — FERROUS SULFATE 325 (65 FE) MG PO TABS: 325.0000 mg | ORAL_TABLET | Freq: Two times a day (BID) | ORAL | 3 refills | Status: AC

## 2016-01-28 MED ORDER — FERROUS SULFATE 325 (65 FE) MG PO TABS
325.0000 mg | ORAL_TABLET | Freq: Two times a day (BID) | ORAL | Status: DC
  Administered 2016-01-28: 325 mg via ORAL
  Filled 2016-01-28: qty 1

## 2016-01-28 NOTE — Progress Notes (Signed)
PPD#2 Pt without complaints. Lochia wnl VSSAF IMP/ Stable Plan/ Will discharge to home

## 2016-01-28 NOTE — Discharge Summary (Signed)
Obstetric Discharge Summary Reason for Admission: induction of labor Prenatal Procedures: NST and ultrasound Intrapartum Procedures: spontaneous vaginal delivery Postpartum Procedures: none Complications-Operative and Postpartum: none Hemoglobin  Date Value Ref Range Status  01/27/2016 8.8 (L) 12.0 - 15.0 g/dL Final   HCT  Date Value Ref Range Status  01/27/2016 26.7 (L) 36.0 - 46.0 % Final    Physical Exam:  General: alert and cooperative Lochia: appropriate Uterine Fundus: firm  Discharge Diagnoses: Term Pregnancy-delivered and hypertension and anemia  Discharge Information: Date: 01/28/2016 Activity: pelvic rest Diet: routine Medications: PNV, Ibuprofen, Iron and labetalol Condition: stable Instructions: refer to practice specific booklet Discharge to: home Follow-up Information    [facility] Follow up in 1 week(s).   Specialty:  Obstetrics Contact information: [address] [place] [address] [phone]           Newborn Data: Live born female  Birth Weight: 7 lb 1.4 oz (3215 g) APGAR: 8, 9  Home with mother.  ANDERSON,MARK E 01/28/2016, 8:32 AM

## 2016-01-30 NOTE — Addendum Note (Signed)
Addendum  created 01/30/16 1833 by Phillips GroutPeter Samanthamarie Ezzell, MD   Anesthesia Staff edited

## 2016-02-01 NOTE — Addendum Note (Signed)
Addendum  created 02/01/16 1432 by Jairo Benarswell Lillion Elbert, MD   Anesthesia Staff edited

## 2016-07-16 ENCOUNTER — Encounter: Payer: Self-pay | Admitting: Cardiovascular Disease

## 2016-07-16 ENCOUNTER — Ambulatory Visit (INDEPENDENT_AMBULATORY_CARE_PROVIDER_SITE_OTHER): Payer: Medicaid Other | Admitting: Cardiovascular Disease

## 2016-07-16 VITALS — BP 138/90 | HR 82 | Ht 65.0 in | Wt 251.0 lb

## 2016-07-16 DIAGNOSIS — I10 Essential (primary) hypertension: Secondary | ICD-10-CM

## 2016-07-16 DIAGNOSIS — I422 Other hypertrophic cardiomyopathy: Secondary | ICD-10-CM | POA: Diagnosis not present

## 2016-07-16 MED ORDER — METOPROLOL TARTRATE 50 MG PO TABS: 50.0000 mg | ORAL_TABLET | Freq: Two times a day (BID) | ORAL | 3 refills | Status: AC

## 2016-07-16 NOTE — Patient Instructions (Addendum)
Dr Royann Shiversroitoru has recommended making the following medication changes: 1. STOP Labetalol 2. START Metoprolol 50 mg - take 1 tablet by mouth twice daily  Your physician has requested that you regularly monitor and record your blood pressure readings at home. Please use the same machine to check your readings and record them. Please send your readings via mychart in 1 month.  Dr Royann Shiversroitoru recommends that you schedule a follow-up appointment in 12 months. You will receive a reminder letter in the mail two months in advance. If you don't receive a letter, please call our office to schedule the follow-up appointment.  If you need a refill on your cardiac medications before your next appointment, please call your pharmacy.

## 2016-07-16 NOTE — Progress Notes (Signed)
Cardiology Consultation Note    Date:  07/18/2016   ID:  Stacey Levine, DOB 18-Sep-1985, MRN [id number]  PCP:  Hal Morales, MD  Cardiologist:   Thurmon Fair, MD   Chief Complaint  Patient presents with  . Follow-up    8 months  . Shortness of Breath  . Chest Pain  Here to discuss findings on echocardiogram performed during pregnancy.  History of Present Illness:  Stacey Levine is a 31 y.o. female now roughly 6 months After delivery of her second child, presenting in follow-up for evaluation of episodic hypertension.   She took labetalol during pregnancy, but this is having a relatively mediocre effect on blood pressure control. Her diastolic blood pressures around 90. She has occasional headaches but otherwise does not complain of symptoms of dyspnea, angina, palpitations, syncope, leg edema or other cardiovascular complaints. Both her mother and grandmother have high blood pressure. She is morbidly obese with a BMI of almost 42. She takes ibuprofen about once a week for her headaches.  Her echocardiogram shows a disproportionate amount of left ventricular hypertrophy for her degree of blood pressure elevation. The septum measured 17 mm, the posterior wall 19 mm. Ejection fraction is normal at 60-65 %. Mitral annulus diastolic Tissue Doppler velocities were only mildly decreased. Global LV longitudinal strain was borderline at 18%. Does not have a personal history of ventricular arrhythmia or syncope. There is no family history of hypertrophic cardiomyopathy, unexplained syncope, sudden cardiac death or congestive heart failure.  Past Medical History:  Diagnosis Date  . H/O varicella   . Hypertension    no meds    Past Surgical History:  Procedure Laterality Date  . CHOLECYSTECTOMY N/A 09/2011  . LAPAROSCOPY N/A    Remove gallstones    Current Medications: Outpatient Medications Prior to Visit  Medication Sig Dispense Refill  . ferrous sulfate 325 (65 FE) MG tablet Take  1 tablet (325 mg total) by mouth 2 (two) times daily with a meal. 60 tablet 3  . ibuprofen (ADVIL,MOTRIN) 600 MG tablet Take 1 tablet (600 mg total) by mouth every 6 (six) hours. 30 tablet 0  . Prenatal Vit-Fe Fumarate-FA (PRENATAL MULTIVITAMIN) TABS tablet Take 1 tablet by mouth daily at 12 noon.    . labetalol (NORMODYNE) 100 MG tablet Take 1 tablet (100 mg total) by mouth 2 (two) times daily. 60 tablet 2   No facility-administered medications prior to visit.      Allergies:   Pollen extract   Social History   Social History  . Marital status: Single    Spouse name: N/A  . Number of children: N/A  . Years of education: N/A   Social History Main Topics  . Smoking status: Never Smoker  . Smokeless tobacco: Never Used  . Alcohol use No  . Drug use: No  . Sexual activity: Yes    Birth control/ protection: None   Other Topics Concern  . None   Social History Narrative  . None     Family History:  The patient's family history includes Asthma in her cousin; Cancer in her maternal grandfather; Diabetes in her maternal grandmother; Hypertension in her maternal grandfather and mother.   ROS:   Please see the history of present illness.    ROS All other systems reviewed and are negative.   PHYSICAL EXAM:   VS:  BP 138/90   Pulse 82   Ht 5\' 5"  (1.651 m)   Wt 251 lb (113.9 kg)   BMI  41.77 kg/m    GEN: Well nourished, well developed, in no acute distress  HEENT: normal  Neck: no JVD, carotid bruits, or masses Cardiac: RRR; no murmurs, rubs, or gallops,no edema  Respiratory:  clear to auscultation bilaterally, normal work of breathing GI: soft, nontender, nondistended, + BS MS: no deformity or atrophy  Skin: warm and dry, no rash Neuro:  Alert and Oriented x 3, Strength and sensation are intact Psych: euthymic mood, full affect  Wt Readings from Last 3 Encounters:  07/16/16 251 lb (113.9 kg)  01/26/16 246 lb (111.6 kg)  01/03/16 246 lb (111.6 kg)      Studies/Labs  Reviewed:   EKG:  EKG is not ordered today.    Recent Labs: 01/03/2016: ALT 11; BUN <5; Creatinine, Ser 0.59; Potassium 3.1; Sodium 134 01/27/2016: Hemoglobin 8.8; Platelets 279    ASSESSMENT:    1. Hypertrophic cardiomyopathy (HCC)   2. Essential hypertension      PLAN:  In order of problems listed above:  1. HCM: The degree of hypertrophy appears disproportionate to the elevation in her blood pressure, but there is no family history to suggest malignant forms of hypertrophic cardiomyopathy and no personal history to suggest infiltrative disorders. The first step will remain treatment of her elevated blood pressure, preferentially with beta blockers. Consider cardiac MRI to clarify the diagnosis, but I'm not sure that this will change treatment. 2. HTN: Elevation in blood pressure persists even after delivery. She may benefit from a pure beta blocker view of the findings on her echo. We'll switch to metoprolol 50 mg twice daily. She'll send me blood pressure and heart rate recordings through [email or web address].     Medication Adjustments/Labs and Tests Ordered: Current medicines are reviewed at length with the patient today.  Concerns regarding medicines are outlined above.  Medication changes, Labs and Tests ordered today are listed in the Patient Instructions below. Patient Instructions  Dr Royann Shiversroitoru has recommended making the following medication changes: 1. STOP Labetalol 2. START Metoprolol 50 mg - take 1 tablet by mouth twice daily  Your physician has requested that you regularly monitor and record your blood pressure readings at home. Please use the same machine to check your readings and record them. Please send your readings via mychart in 1 month.  Dr Royann Shiversroitoru recommends that you schedule a follow-up appointment in 12 months. You will receive a reminder letter in the mail two months in advance. If you don't receive a letter, please call our office to schedule the follow-up  appointment.  If you need a refill on your cardiac medications before your next appointment, please call your pharmacy.    Signed, Thurmon FairMihai Julisa Flippo, MD  07/18/2016 4:31 PM    [facility] [address], [place], [place]  [address] Phone: [phone]; Fax: [phone]

## 2016-07-18 DIAGNOSIS — I10 Essential (primary) hypertension: Secondary | ICD-10-CM | POA: Insufficient documentation
